# Patient Record
Sex: Male | Born: 1950 | Race: White | Hispanic: No | Marital: Married | Smoking: Never smoker
Health system: Southern US, Community
[De-identification: ages and names within clinical notes are randomized; demographics above are authoritative.]

## PROBLEM LIST (undated history)

## (undated) DIAGNOSIS — I10 Essential (primary) hypertension: Secondary | ICD-10-CM

## (undated) DIAGNOSIS — F32A Depression, unspecified: Secondary | ICD-10-CM

## (undated) DIAGNOSIS — R002 Palpitations: Secondary | ICD-10-CM

## (undated) DIAGNOSIS — I471 Supraventricular tachycardia: Secondary | ICD-10-CM

## (undated) DIAGNOSIS — M1990 Unspecified osteoarthritis, unspecified site: Secondary | ICD-10-CM

## (undated) DIAGNOSIS — C801 Malignant (primary) neoplasm, unspecified: Secondary | ICD-10-CM

## (undated) DIAGNOSIS — R7303 Prediabetes: Secondary | ICD-10-CM

## (undated) DIAGNOSIS — K219 Gastro-esophageal reflux disease without esophagitis: Secondary | ICD-10-CM

## (undated) DIAGNOSIS — E785 Hyperlipidemia, unspecified: Secondary | ICD-10-CM

## (undated) HISTORY — DX: Hyperlipidemia, unspecified: E78.5

## (undated) HISTORY — DX: Palpitations: R00.2

## (undated) HISTORY — DX: Essential (primary) hypertension: I10

## (undated) HISTORY — DX: Gastro-esophageal reflux disease without esophagitis: K21.9

## (undated) HISTORY — PX: HERNIA REPAIR: SHX51

---

## 1998-07-13 ENCOUNTER — Ambulatory Visit (HOSPITAL_COMMUNITY): Admission: RE | Admit: 1998-07-13 | Discharge: 1998-07-13 | Payer: Self-pay | Admitting: Family Medicine

## 1998-07-13 ENCOUNTER — Encounter: Payer: Self-pay | Admitting: Family Medicine

## 1999-10-03 ENCOUNTER — Ambulatory Visit (HOSPITAL_COMMUNITY): Admission: RE | Admit: 1999-10-03 | Discharge: 1999-10-03 | Payer: Self-pay | Admitting: Family Medicine

## 1999-10-03 ENCOUNTER — Encounter: Payer: Self-pay | Admitting: Family Medicine

## 2015-02-02 ENCOUNTER — Emergency Department (HOSPITAL_COMMUNITY)
Admission: EM | Admit: 2015-02-02 | Discharge: 2015-02-03 | Disposition: A | Discharge status: Home / Self Care | Payer: 59 | Attending: Emergency Medicine | Admitting: Emergency Medicine

## 2015-02-02 ENCOUNTER — Encounter (HOSPITAL_COMMUNITY): Payer: Self-pay | Admitting: *Deleted

## 2015-02-02 DIAGNOSIS — E785 Hyperlipidemia, unspecified: Secondary | ICD-10-CM | POA: Diagnosis not present

## 2015-02-02 DIAGNOSIS — R339 Retention of urine, unspecified: Secondary | ICD-10-CM | POA: Diagnosis present

## 2015-02-02 DIAGNOSIS — N390 Urinary tract infection, site not specified: Secondary | ICD-10-CM

## 2015-02-02 DIAGNOSIS — E876 Hypokalemia: Secondary | ICD-10-CM | POA: Insufficient documentation

## 2015-02-02 DIAGNOSIS — R140 Abdominal distension (gaseous): Secondary | ICD-10-CM | POA: Insufficient documentation

## 2015-02-02 DIAGNOSIS — Z8719 Personal history of other diseases of the digestive system: Secondary | ICD-10-CM | POA: Diagnosis not present

## 2015-02-02 DIAGNOSIS — Z7982 Long term (current) use of aspirin: Secondary | ICD-10-CM | POA: Diagnosis not present

## 2015-02-02 DIAGNOSIS — I10 Essential (primary) hypertension: Secondary | ICD-10-CM | POA: Insufficient documentation

## 2015-02-02 DIAGNOSIS — Z79899 Other long term (current) drug therapy: Secondary | ICD-10-CM | POA: Diagnosis not present

## 2015-02-02 LAB — URINALYSIS, ROUTINE W REFLEX MICROSCOPIC
Bilirubin Urine: NEGATIVE
GLUCOSE, UA: NEGATIVE mg/dL
HGB URINE DIPSTICK: NEGATIVE
Ketones, ur: NEGATIVE mg/dL
Leukocytes, UA: NEGATIVE
Nitrite: POSITIVE — AB
Protein, ur: NEGATIVE mg/dL
SPECIFIC GRAVITY, URINE: 1.015 (ref 1.005–1.030)
Urobilinogen, UA: 0.2 mg/dL (ref 0.0–1.0)
pH: 5.5 (ref 5.0–8.0)

## 2015-02-02 LAB — URINE MICROSCOPIC-ADD ON

## 2015-02-02 NOTE — ED Provider Notes (Signed)
CSN: [id number]     Arrival date & time 02/02/15  2246 History  By signing my name below, I, Hilda Lias, attest that this documentation has been prepared under the direction and in the presence of Merryl Hacker, MD. Electronically Signed: Hilda Lias, ED Scribe. 02/02/2015. 11:20 PM.  Chief Complaint  Patient presents with  . Urinary Retention      The history is provided by the patient. No language interpreter was used.   HPI Comments: Edwin Levine is a 64 y.o. male who presents to the Emergency Department complaining of urinary retention that has been present since earlier today. Pt states that he has not been able to urinate for around 4 hours, and reports when he urinated earlier today that he produced very little, and it was painful to do so. Patient does report that he went all day at work without urinating and feels this contributed to his urinary retention. Pt states he has never had difficulty urinating in the past. Pt reports drinking a lot of water and cranberry juice prior to arrival at ED, but states he still cannot urinate. Pt denies back pain or any other symptoms. Denies fevers.    Past Medical History  Diagnosis Date  . Hypertension   . Hyperlipidemia   . GERD (gastroesophageal reflux disease)   . Palpitations    Past Surgical History  Procedure Laterality Date  . Hernia repair     History reviewed. No pertinent family history. Social History  Substance Use Topics  . Smoking status: Never Smoker   . Smokeless tobacco: None  . Alcohol Use: No    Review of Systems  Constitutional: Negative.  Negative for fever.  Respiratory: Negative.  Negative for chest tightness and shortness of breath.   Cardiovascular: Negative.  Negative for chest pain.  Gastrointestinal: Positive for abdominal distention. Negative for nausea, vomiting and abdominal pain.  Genitourinary: Positive for dysuria and difficulty urinating. Negative for flank pain.  All other  systems reviewed and are negative.     Allergies  Review of patient's allergies indicates no known allergies.  Home Medications   Prior to Admission medications   Medication Sig Start Date End Date Taking? Authorizing Provider  acetaminophen (TYLENOL) 500 MG tablet Take 1,500 mg by mouth once as needed for mild pain or moderate pain.   Yes Historical Provider, MD  aspirin 81 MG tablet Take 81 mg by mouth daily.   Yes Historical Provider, MD  atorvastatin (LIPITOR) 40 MG tablet Take 40 mg by mouth daily.   Yes Historical Provider, MD  Cholecalciferol (VITAMIN D) 2000 UNITS CAPS Take 1 capsule by mouth daily.   Yes Historical Provider, MD  Cinnamon 500 MG capsule Take 1,000 mg by mouth daily.   Yes Historical Provider, MD  lisinopril-hydrochlorothiazide (PRINZIDE,ZESTORETIC) 20-25 MG per tablet Take 1 tablet by mouth daily.   Yes Historical Provider, MD  naproxen sodium (ANAPROX) 220 MG tablet Take 220 mg by mouth 2 (two) times daily as needed (for pain).    Yes Historical Provider, MD  Omega-3 Fatty Acids (FISH OIL) 1000 MG CAPS Take 1 capsule by mouth daily.    Yes Historical Provider, MD  S-Adenosylmethionine-B6-B12-FA (MOOD PLUS STRESS RELIEF PO) Take 1 tablet by mouth daily.   Yes Historical Provider, MD  sildenafil (VIAGRA) 100 MG tablet Take 100 mg by mouth as needed for erectile dysfunction.   Yes Historical Provider, MD  ciprofloxacin (CIPRO) 500 MG tablet Take 1 tablet (500 mg total) by mouth 2 (  two) times daily. 02/03/15   Merryl Hacker, MD   BP 154/70 mmHg  Pulse 83  Temp(Src) 98.1 F (36.7 C) (Oral)  Resp 20  Ht 5\' 9"  (1.753 m)  Wt 210 lb (95.255 kg)  BMI 31.00 kg/m2  SpO2 98% Physical Exam  Constitutional: He is oriented to person, place, and time. No distress.  Uncomfortable appearing  HENT:  Head: Normocephalic and atraumatic.  Cardiovascular: Normal rate, regular rhythm and normal heart sounds.   No murmur heard. Pulmonary/Chest: Effort normal and breath  sounds normal. No respiratory distress. He has no wheezes.  Abdominal: Soft. Bowel sounds are normal. He exhibits distension. There is tenderness. There is no rebound.  Fullness noted over the suprapubic region with mild tenderness to palpation  Musculoskeletal: He exhibits no edema.  Neurological: He is alert and oriented to person, place, and time.  Skin: Skin is warm and dry.  Psychiatric: He has a normal mood and affect.  Nursing note and vitals reviewed.   ED Course  Procedures (including critical care time)  DIAGNOSTIC STUDIES: Oxygen Saturation is 98% on room air, normal by my interpretation.    COORDINATION OF CARE: 11:20 PM Discussed treatment plan with pt at bedside and pt agreed to plan.   Labs Review Labs Reviewed  URINALYSIS, ROUTINE W REFLEX MICROSCOPIC (NOT AT [facility]) - Abnormal; Notable for the following:    Nitrite POSITIVE (*)    All other components within normal limits  BASIC METABOLIC PANEL - Abnormal; Notable for the following:    Sodium 131 (*)    Potassium 3.0 (*)    Chloride 98 (*)    Glucose, Bld 127 (*)    All other components within normal limits  URINE CULTURE  URINE MICROSCOPIC-ADD ON    Imaging Review No results found. I have personally reviewed and evaluated these images and lab results as part of my medical decision-making.   EKG Interpretation None      MDM   Final diagnoses:  Urinary retention  UTI (lower urinary tract infection)  Hypokalemia    Patient presents with urinary retention. Reports that he has had dysuria and minimal urinary output today after holding his urine at work. Nontoxic on exam. Suprapubic fullness but no significant tenderness. Afebrile. After attempting to urinate, patient was able to produce a small amount of urine.  Postvoid residual was greater than 900 mL. Urinary catheter placed. Urinalysis is nitrite positive with 0-2 white cells and few bacteria. Will place on antibiotics.  Patient with mild  hypokalemia. Renal function preserved. Will discharge home with Cipro and urology follow-up. Catheterto be removed in 3-5 days.  Discussed with patient and his wife. Return precautions given.  After history, exam, and medical workup I feel the patient has been appropriately medically screened and is safe for discharge home. Pertinent diagnoses were discussed with the patient. Patient was given return precautions.  I personally performed the services described in this documentation, which was scribed in my presence. The recorded information has been reviewed and is accurate.    Merryl Hacker, MD 02/03/15 [phone]

## 2015-02-02 NOTE — ED Notes (Addendum)
Pt states he has been unable to urinate x 3.5-4 hrs. Pt also c/o lower abdominal pain. Pt also c/o urinary frequency with a very small amount of urine.

## 2015-02-03 LAB — BASIC METABOLIC PANEL
ANION GAP: 7 (ref 5–15)
BUN: 18 mg/dL (ref 6–20)
CALCIUM: 9 mg/dL (ref 8.9–10.3)
CO2: 26 mmol/L (ref 22–32)
Chloride: 98 mmol/L — ABNORMAL LOW (ref 101–111)
Creatinine, Ser: 1.04 mg/dL (ref 0.61–1.24)
GFR calc Af Amer: >60 mL/min (ref >60)
Glucose, Bld: 127 mg/dL — ABNORMAL HIGH (ref 65–99)
POTASSIUM: 3 mmol/L — AB (ref 3.5–5.1)
SODIUM: 131 mmol/L — AB (ref 135–145)

## 2015-02-03 MED ORDER — CIPROFLOXACIN HCL 500 MG PO TABS: 500.0000 mg | ORAL_TABLET | Freq: Two times a day (BID) | ORAL | Status: DC

## 2015-02-03 NOTE — ED Notes (Signed)
Patient given discharge instruction, verbalized understand. Patient ambulatory out of the department.  

## 2015-02-03 NOTE — Discharge Instructions (Signed)
Acute Urinary Retention, Male °Acute urinary retention is the temporary inability to urinate. °This is a common problem in older men. As men age their prostates become larger and block the flow of urine from the bladder. This is usually a problem that has come on gradually.  °HOME CARE INSTRUCTIONS °If you are sent home with a Foley catheter and a drainage system, you will need to discuss the best course of action with your health care provider. While the catheter is in, maintain a good intake of fluids. Keep the drainage bag emptied and lower than your catheter. This is so that contaminated urine will not flow back into your bladder, which could lead to a urinary tract infection. °There are two main types of drainage bags. One is a large bag that usually is used at night. It has a good capacity that will allow you to sleep through the night without having to empty it. The second type is called a leg bag. It has a smaller capacity, so it needs to be emptied more frequently. However, the main advantage is that it can be attached by a leg strap and can go underneath your clothing, allowing you the freedom to move about or leave your home. °Only take over-the-counter or prescription medicines for pain, discomfort, or fever as directed by your health care provider.  °SEEK MEDICAL CARE IF: °· You develop a low-grade fever. °· You experience spasms or leakage of urine with the spasms. °SEEK IMMEDIATE MEDICAL CARE IF:  °· You develop chills or fever. °· Your catheter stops draining urine. °· Your catheter falls out. °· You start to develop increased bleeding that does not respond to rest and increased fluid intake. °MAKE SURE YOU: °· Understand these instructions. °· Will watch your condition. °· Will get help right away if you are not doing well or get worse. °  °This information is not intended to replace advice given to you by your health care provider. Make sure you discuss any questions you have with your health care  provider. °  °Document Released: 07/17/2000 Document Revised: 08/25/2014 Document Reviewed: 09/19/2012 °Elsevier Interactive Patient Education ©2016 [employer] ° ° °Urinary Tract Infection °Urinary tract infections (UTIs) can develop anywhere along your urinary tract. Your urinary tract is your body's drainage system for removing wastes and extra water. Your urinary tract includes two kidneys, two ureters, a bladder, and a urethra. Your kidneys are a pair of bean-shaped organs. Each kidney is about the size of your fist. They are located below your ribs, one on each side of your spine. °CAUSES °Infections are caused by microbes, which are microscopic organisms, including fungi, viruses, and bacteria. These organisms are so small that they can only be seen through a microscope. Bacteria are the microbes that most commonly cause UTIs. °SYMPTOMS  °Symptoms of UTIs may vary by age and gender of the patient and by the location of the infection. Symptoms in young women typically include a frequent and intense urge to urinate and a painful, burning feeling in the bladder or urethra during urination. Older women and men are more likely to be tired, shaky, and weak and have muscle aches and abdominal pain. A fever may mean the infection is in your kidneys. Other symptoms of a kidney infection include pain in your back or sides below the ribs, nausea, and vomiting. °DIAGNOSIS °To diagnose a UTI, your caregiver will ask you about your symptoms. Your caregiver will also ask you to provide a urine sample. The urine   be tested for bacteria and white blood cells. White blood cells are made by your body to help fight infection. TREATMENT  Typically, UTIs can be treated with medication. Because most UTIs are caused by a bacterial infection, they usually can be treated with the use of antibiotics. The choice of antibiotic and length of treatment depend on your symptoms and the type of bacteria causing your  infection. HOME CARE INSTRUCTIONS  If you were prescribed antibiotics, take them exactly as your caregiver instructs you. Finish the medication even if you feel better after you have only taken some of the medication.  Drink enough water and fluids to keep your urine clear or pale yellow.  Avoid caffeine, tea, and carbonated beverages. They tend to irritate your bladder.  Empty your bladder often. Avoid holding urine for long periods of time.  Empty your bladder before and after sexual intercourse.  After a bowel movement, women should cleanse from front to back. Use each tissue only once. SEEK MEDICAL CARE IF:   You have back pain.  You develop a fever.  Your symptoms do not begin to resolve within 3 days. SEEK IMMEDIATE MEDICAL CARE IF:   You have severe back pain or lower abdominal pain.  You develop chills.  You have nausea or vomiting.  You have continued burning or discomfort with urination. MAKE SURE YOU:   Understand these instructions.  Will watch your condition.  Will get help right away if you are not doing well or get worse.   This information is not intended to replace advice given to you by your health care provider. Make sure you discuss any questions you have with your health care provider.   Document Released: 01/18/2005 Document Revised: 12/30/2014 Document Reviewed: 05/19/2011 Elsevier Interactive Patient Education 2016 [employer]. Hypokalemia Hypokalemia means that the amount of potassium in the blood is lower than normal.Potassium is a chemical, called an electrolyte, that helps regulate the amount of fluid in the body. It also stimulates muscle contraction and helps nerves function properly.Most of the body's potassium is inside of cells, and only a very small amount is in the blood. Because the amount in the blood is so small, minor changes can be life-threatening. CAUSES  Antibiotics.  Diarrhea or vomiting.  Using laxatives too much,  which can cause diarrhea.  Chronic kidney disease.  Water pills (diuretics).  Eating disorders (bulimia).  Low magnesium level.  Sweating a lot. SIGNS AND SYMPTOMS  Weakness.  Constipation.  Fatigue.  Muscle cramps.  Mental confusion.  Skipped heartbeats or irregular heartbeat (palpitations).  Tingling or numbness. DIAGNOSIS  Your health care provider can diagnose hypokalemia with blood tests. In addition to checking your potassium level, your health care provider may also check other lab tests. TREATMENT Hypokalemia can be treated with potassium supplements taken by mouth or adjustments in your current medicines. If your potassium level is very low, you may need to get potassium through a vein (IV) and be monitored in the hospital. A diet high in potassium is also helpful. Foods high in potassium are:  Nuts, such as peanuts and pistachios.  Seeds, such as sunflower seeds and pumpkin seeds.  Peas, lentils, and lima beans.  Whole grain and bran cereals and breads.  Fresh fruit and vegetables, such as apricots, avocado, bananas, cantaloupe, kiwi, oranges, tomatoes, asparagus, and potatoes.  Orange and tomato juices.  Red meats.  Fruit yogurt. HOME CARE INSTRUCTIONS  Take all medicines as prescribed by your health care provider.  Maintain a  healthy diet by including nutritious food, such as fruits, vegetables, nuts, whole grains, and lean meats.  If you are taking a laxative, be sure to follow the directions on the label. SEEK MEDICAL CARE IF:  Your weakness gets worse.  You feel your heart pounding or racing.  You are vomiting or having diarrhea.  You are diabetic and having trouble keeping your blood glucose in the normal range. SEEK IMMEDIATE MEDICAL CARE IF:  You have chest pain, shortness of breath, or dizziness.  You are vomiting or having diarrhea for more than 2 days.  You faint. MAKE SURE YOU:   Understand these instructions.  Will watch  your condition.  Will get help right away if you are not doing well or get worse.   This information is not intended to replace advice given to you by your health care provider. Make sure you discuss any questions you have with your health care provider.   Document Released: 04/10/2005 Document Revised: 05/01/2014 Document Reviewed: 10/11/2012 Elsevier Interactive Patient Education [employer].

## 2015-02-03 NOTE — ED Notes (Signed)
Emptied total of 1800 cc output yellow, clear urine, changed to leg bag

## 2015-02-04 LAB — URINE CULTURE: CULTURE: NO GROWTH

## 2016-02-14 ENCOUNTER — Emergency Department (HOSPITAL_COMMUNITY): Payer: 59

## 2016-02-14 ENCOUNTER — Encounter (HOSPITAL_COMMUNITY): Payer: Self-pay | Admitting: Emergency Medicine

## 2016-02-14 ENCOUNTER — Observation Stay (HOSPITAL_COMMUNITY)
Admission: EM | Admit: 2016-02-14 | Discharge: 2016-02-17 | Disposition: A | Discharge status: Home / Self Care | Payer: 59 | Attending: Internal Medicine | Admitting: Internal Medicine

## 2016-02-14 DIAGNOSIS — E042 Nontoxic multinodular goiter: Secondary | ICD-10-CM | POA: Diagnosis not present

## 2016-02-14 DIAGNOSIS — N390 Urinary tract infection, site not specified: Secondary | ICD-10-CM

## 2016-02-14 DIAGNOSIS — B961 Klebsiella pneumoniae [K. pneumoniae] as the cause of diseases classified elsewhere: Secondary | ICD-10-CM | POA: Diagnosis not present

## 2016-02-14 DIAGNOSIS — R7302 Impaired glucose tolerance (oral): Secondary | ICD-10-CM | POA: Insufficient documentation

## 2016-02-14 DIAGNOSIS — E876 Hypokalemia: Secondary | ICD-10-CM | POA: Diagnosis not present

## 2016-02-14 DIAGNOSIS — Z7982 Long term (current) use of aspirin: Secondary | ICD-10-CM | POA: Insufficient documentation

## 2016-02-14 DIAGNOSIS — Z8249 Family history of ischemic heart disease and other diseases of the circulatory system: Secondary | ICD-10-CM | POA: Insufficient documentation

## 2016-02-14 DIAGNOSIS — E785 Hyperlipidemia, unspecified: Secondary | ICD-10-CM | POA: Insufficient documentation

## 2016-02-14 DIAGNOSIS — N3000 Acute cystitis without hematuria: Secondary | ICD-10-CM | POA: Insufficient documentation

## 2016-02-14 DIAGNOSIS — R338 Other retention of urine: Secondary | ICD-10-CM | POA: Insufficient documentation

## 2016-02-14 DIAGNOSIS — R55 Syncope and collapse: Secondary | ICD-10-CM

## 2016-02-14 DIAGNOSIS — W2209XA Striking against other stationary object, initial encounter: Secondary | ICD-10-CM | POA: Diagnosis not present

## 2016-02-14 DIAGNOSIS — R350 Frequency of micturition: Secondary | ICD-10-CM | POA: Insufficient documentation

## 2016-02-14 DIAGNOSIS — K219 Gastro-esophageal reflux disease without esophagitis: Secondary | ICD-10-CM | POA: Insufficient documentation

## 2016-02-14 DIAGNOSIS — I471 Supraventricular tachycardia, unspecified: Secondary | ICD-10-CM | POA: Diagnosis present

## 2016-02-14 DIAGNOSIS — I10 Essential (primary) hypertension: Secondary | ICD-10-CM | POA: Insufficient documentation

## 2016-02-14 DIAGNOSIS — R002 Palpitations: Secondary | ICD-10-CM

## 2016-02-14 DIAGNOSIS — S0003XA Contusion of scalp, initial encounter: Secondary | ICD-10-CM | POA: Diagnosis not present

## 2016-02-14 DIAGNOSIS — N401 Enlarged prostate with lower urinary tract symptoms: Secondary | ICD-10-CM | POA: Insufficient documentation

## 2016-02-14 DIAGNOSIS — I4892 Unspecified atrial flutter: Secondary | ICD-10-CM | POA: Insufficient documentation

## 2016-02-14 LAB — URINALYSIS, ROUTINE W REFLEX MICROSCOPIC
BILIRUBIN URINE: NEGATIVE
Glucose, UA: NEGATIVE mg/dL
HGB URINE DIPSTICK: NEGATIVE
KETONES UR: NEGATIVE mg/dL
NITRITE: POSITIVE — AB
PH: 6 (ref 5.0–8.0)
Protein, ur: NEGATIVE mg/dL
Specific Gravity, Urine: 1.014 (ref 1.005–1.030)

## 2016-02-14 LAB — CBC
HEMATOCRIT: 40.3 % (ref 39.0–52.0)
Hemoglobin: 13.9 g/dL (ref 13.0–17.0)
MCH: 32.2 pg (ref 26.0–34.0)
MCHC: 34.5 g/dL (ref 30.0–36.0)
MCV: 93.3 fL (ref 78.0–100.0)
Platelets: 205 10*3/uL (ref 150–400)
RBC: 4.32 MIL/uL (ref 4.22–5.81)
RDW: 12.6 % (ref 11.5–15.5)
WBC: 8.1 10*3/uL (ref 4.0–10.5)

## 2016-02-14 LAB — BASIC METABOLIC PANEL
Anion gap: 10 (ref 5–15)
BUN: 16 mg/dL (ref 6–20)
CALCIUM: 9.7 mg/dL (ref 8.9–10.3)
CO2: 25 mmol/L (ref 22–32)
Chloride: 103 mmol/L (ref 101–111)
Creatinine, Ser: 0.95 mg/dL (ref 0.61–1.24)
GFR calc Af Amer: >60 mL/min (ref >60)
GLUCOSE: 110 mg/dL — AB (ref 65–99)
POTASSIUM: 3.3 mmol/L — AB (ref 3.5–5.1)
Sodium: 138 mmol/L (ref 135–145)

## 2016-02-14 LAB — TROPONIN I: Troponin I: <0.03 ng/mL (ref <0.03)

## 2016-02-14 LAB — URINE MICROSCOPIC-ADD ON

## 2016-02-14 LAB — I-STAT TROPONIN, ED: Troponin i, poc: 0 ng/mL (ref 0.00–0.08)

## 2016-02-14 MED ORDER — TAMSULOSIN HCL 0.4 MG PO CAPS: 0.8000 mg | ORAL_CAPSULE | Freq: Every day | ORAL | Status: DC

## 2016-02-14 MED ORDER — TAMSULOSIN HCL 0.4 MG PO CAPS
0.8000 mg | ORAL_CAPSULE | Freq: Every day | ORAL | Status: DC
  Administered 2016-02-14: 0.8 mg via ORAL
  Filled 2016-02-14: qty 2

## 2016-02-14 MED ORDER — SODIUM CHLORIDE 0.9% FLUSH
3.0000 mL | Freq: Two times a day (BID) | INTRAVENOUS | Status: DC
  Administered 2016-02-14 – 2016-02-17 (×6): 3 mL via INTRAVENOUS

## 2016-02-14 MED ORDER — ATORVASTATIN CALCIUM 40 MG PO TABS
40.0000 mg | ORAL_TABLET | Freq: Every day | ORAL | Status: DC
  Administered 2016-02-15 – 2016-02-17 (×3): 40 mg via ORAL
  Filled 2016-02-14 (×3): qty 1

## 2016-02-14 MED ORDER — ACETAMINOPHEN 650 MG RE SUPP: 650.0000 mg | Freq: Four times a day (QID) | RECTAL | Status: DC | PRN

## 2016-02-14 MED ORDER — SODIUM CHLORIDE 0.9 % IV BOLUS (SEPSIS)
1000.0000 mL | Freq: Once | INTRAVENOUS | Status: AC
  Administered 2016-02-14: 1000 mL via INTRAVENOUS

## 2016-02-14 MED ORDER — LISINOPRIL 20 MG PO TABS
20.0000 mg | ORAL_TABLET | Freq: Every day | ORAL | Status: DC
  Administered 2016-02-15: 20 mg via ORAL
  Filled 2016-02-14: qty 1

## 2016-02-14 MED ORDER — SODIUM CHLORIDE 0.9 % IV SOLN
INTRAVENOUS | Status: AC
  Administered 2016-02-14: 23:00:00 via INTRAVENOUS

## 2016-02-14 MED ORDER — CEPHALEXIN 500 MG PO CAPS
500.0000 mg | ORAL_CAPSULE | Freq: Three times a day (TID) | ORAL | Status: DC
  Administered 2016-02-14: 500 mg via ORAL
  Filled 2016-02-14: qty 2

## 2016-02-14 MED ORDER — HYDROCODONE-ACETAMINOPHEN 5-325 MG PO TABS: 1.0000 | ORAL_TABLET | ORAL | Status: DC | PRN

## 2016-02-14 MED ORDER — ONDANSETRON HCL 4 MG/2ML IJ SOLN: 4.0000 mg | Freq: Four times a day (QID) | INTRAMUSCULAR | Status: DC | PRN

## 2016-02-14 MED ORDER — POTASSIUM CHLORIDE 10 MEQ/100ML IV SOLN
10.0000 meq | INTRAVENOUS | Status: AC
  Administered 2016-02-14 – 2016-02-15 (×2): 10 meq via INTRAVENOUS
  Filled 2016-02-14: qty 100

## 2016-02-14 MED ORDER — ASPIRIN EC 81 MG PO TBEC
81.0000 mg | DELAYED_RELEASE_TABLET | Freq: Every day | ORAL | Status: DC
  Administered 2016-02-15 – 2016-02-17 (×3): 81 mg via ORAL
  Filled 2016-02-14 (×3): qty 1

## 2016-02-14 MED ORDER — ENOXAPARIN SODIUM 40 MG/0.4ML ~~LOC~~ SOLN
40.0000 mg | SUBCUTANEOUS | Status: DC
  Administered 2016-02-14 – 2016-02-16 (×3): 40 mg via SUBCUTANEOUS
  Filled 2016-02-14 (×3): qty 0.4

## 2016-02-14 MED ORDER — ONDANSETRON HCL 4 MG PO TABS: 4.0000 mg | ORAL_TABLET | Freq: Four times a day (QID) | ORAL | Status: DC | PRN

## 2016-02-14 MED ORDER — DEXTROSE 5 % IV SOLN
1.0000 g | INTRAVENOUS | Status: DC
  Administered 2016-02-15 – 2016-02-16 (×2): 1 g via INTRAVENOUS
  Filled 2016-02-14 (×3): qty 10

## 2016-02-14 MED ORDER — POTASSIUM CHLORIDE CRYS ER 20 MEQ PO TBCR
40.0000 meq | EXTENDED_RELEASE_TABLET | Freq: Once | ORAL | Status: AC
  Administered 2016-02-14: 40 meq via ORAL
  Filled 2016-02-14: qty 2

## 2016-02-14 MED ORDER — ACETAMINOPHEN 325 MG PO TABS
650.0000 mg | ORAL_TABLET | Freq: Four times a day (QID) | ORAL | Status: DC | PRN
Start: 2016-02-14 — End: 2016-02-17

## 2016-02-14 MED ORDER — DEXTROSE 5 % IV SOLN
1.0000 g | Freq: Once | INTRAVENOUS | Status: AC
  Administered 2016-02-14: 1 g via INTRAVENOUS
  Filled 2016-02-14: qty 10

## 2016-02-14 NOTE — ED Notes (Signed)
Patient returned from X-ray 

## 2016-02-14 NOTE — ED Notes (Signed)
Resident at bedside.  

## 2016-02-14 NOTE — ED Notes (Signed)
Pt wheeled back to room from waiting room. Pt ambulated to restroom from room, tolerated well.

## 2016-02-14 NOTE — ED Notes (Signed)
Attempted to give reportx1 

## 2016-02-14 NOTE — ED Notes (Addendum)
Patient transported to CT 

## 2016-02-14 NOTE — ED Triage Notes (Signed)
Pt states was standing outside on concrete at work and felt like he was having a "spell where his heart beats irregularity and bared down like he having a BM and woke up on the ground." Pt has abrasion to back on head. Pt is alert and 0x4.

## 2016-02-14 NOTE — H&P (Addendum)
Edwin Levine [id number] DOB: 04/13/1951 DOA: 02/14/2016     PCP: [facility] Specialists: Cardiology  Patient coming from:    home Lives With family    Chief Complaint: Syncope  HPI: Edwin Levine is a 65 y.o. male with medical history significant of arrhythmias possible SVT, hypertension, HLD    Presented with syncopal event at work. Patient has long-standing history of arrhythmias which describes as palpitations which she controls with Valsalva maneuver. Lately his symptoms have been getting worse needs been progressively more frequent and more significant. Today he was at work and had a spell he felt that he had irregular heartbeat he try to have a Valsalva maneuver but woke up on the ground apparently he hit his head. Patient describes sensation of lightheadedness prior to syncopal event. No witnessed seizure activity Of note few weeks ago patient has been diagnosed with urinary tract infection treated with ciprofloxacin but he continues to have suprapubic pain. He denies any associated chest pain or shortness of breath no nausea vomiting no constipation this is first syncopal episode. Patient reports that usually if he drinks caffeinated drink he has palpitations. He has been drinking green tea a little more than usual for the past 3 or 4 months. 4 months ago his Flomax has been increased. Patient reports he have had stress test done, cardiac catheterization and wore a monitor for 30 days but his symptoms were not active during that time reports this was 5 years ago.    IN ER:  Temp (24hrs), Avg:97.8 F (36.6 C), Min:97.8 F (36.6 C), Max:97.8 F (36.6 C)      RR 25 HR 70 BP 1 41/85 Troponin 0 Sodium 138 K3.3 Chest x-ray negative Following Medications were ordered in ER: Medications  cephALEXin (KEFLEX) capsule 500 mg (500 mg Oral Given 02/14/16 1855)  cefTRIAXone (ROCEPHIN) 1 g in dextrose 5 % 50 mL IVPB (not administered)  potassium chloride SA  (K-DUR,KLOR-CON) CR tablet 40 mEq (not administered)      Hospitalist was called for admission for syncope  Review of Systems:    Pertinent positives include: syncope, dizziness, palpitations  Constitutional:  No weight loss, night sweats, Fevers, chills, fatigue, weight loss  HEENT:  No headaches, Difficulty swallowing,Tooth/dental problems,Sore throat,  No sneezing, itching, ear ache, nasal congestion, post nasal drip,  Cardio-vascular:  No chest pain, Orthopnea, PND, anasarca, .no Bilateral lower extremity swelling  GI:  No heartburn, indigestion, abdominal pain, nausea, vomiting, diarrhea, change in bowel habits, loss of appetite, melena, blood in stool, hematemesis Resp:  no shortness of breath at rest. No dyspnea on exertion, No excess mucus, no productive cough, No non-productive cough, No coughing up of blood.No change in color of mucus.No wheezing. Skin:  no rash or lesions. No jaundice GU:  no dysuria, change in color of urine, no urgency or frequency. No straining to urinate.  No flank pain.  Musculoskeletal:  No joint pain or no joint swelling. No decreased range of motion. No back pain.  Psych:  No change in mood or affect. No depression or anxiety. No memory loss.  Neuro: no localizing neurological complaints, no tingling, no weakness, no double vision, no gait abnormality, no slurred speech, no confusion  As per HPI otherwise 10 point review of systems negative.   Past Medical History: Past Medical History:  Diagnosis Date  . GERD (gastroesophageal reflux disease)   . Hyperlipidemia   . Hypertension   . Palpitations    Past Surgical History:  Procedure  Laterality Date  . HERNIA REPAIR       Social History:  Ambulatory   Independently    reports that he has never smoked. He does not have any smokeless tobacco history on file. He reports that he does not drink alcohol or use drugs.  Allergies:  No Known Allergies     Family History:   Family  History  Problem Relation Age of Onset  . Heart Problems Mother   . Aneurysm Father   . Diabetes Neg Hx   . Cancer Neg Hx   . Stroke Neg Hx     Medications: Prior to Admission medications   Medication Sig Start Date End Date Taking? Authorizing Provider  acetaminophen (TYLENOL) 325 MG tablet Take 650 mg by mouth every 6 (six) hours as needed for mild pain.   Yes Historical Provider, MD  aspirin 81 MG tablet Take 81 mg by mouth daily.   Yes Historical Provider, MD  Aspirin-Acetaminophen-Caffeine (GOODY HEADACHE PO) Take 1 packet by mouth daily as needed (headache).   Yes Historical Provider, MD  atorvastatin (LIPITOR) 40 MG tablet Take 40 mg by mouth daily.   Yes Historical Provider, MD  Cyanocobalamin (VITAMIN B-12 PO) Take 1 tablet by mouth daily.   Yes Historical Provider, MD  lisinopril-hydrochlorothiazide (PRINZIDE,ZESTORETIC) 20-25 MG per tablet Take 1 tablet by mouth daily.   Yes Historical Provider, MD  naproxen sodium (ANAPROX) 220 MG tablet Take 220 mg by mouth 2 (two) times daily as needed (for pain).    Yes Historical Provider, MD  Omega-3 Fatty Acids (FISH OIL) 1000 MG CAPS Take 1 capsule by mouth daily.    Yes Historical Provider, MD  S-Adenosylmethionine-B6-B12-FA (MOOD PLUS STRESS RELIEF PO) Take 1 tablet by mouth daily.   Yes Historical Provider, MD  tamsulosin (FLOMAX) 0.4 MG CAPS capsule Take 0.8 mg by mouth daily after supper.   Yes Historical Provider, MD  ciprofloxacin (CIPRO) 500 MG tablet Take 1 tablet (500 mg total) by mouth 2 (two) times daily. Patient not taking: Reported on 02/14/2016 02/03/15   Merryl Hacker, MD    Physical Exam: Patient Vitals for the past 24 hrs:  BP Temp Temp src Pulse Resp SpO2 Height Weight  02/14/16 1845 122/78 - - 66 13 95 % - -  02/14/16 1830 134/84 - - 65 14 95 % - -  02/14/16 1824 134/81 - - 69 13 93 % - -  02/14/16 1815 134/81 - - 72 19 96 % - -  02/14/16 1800 138/81 - - 68 - 94 % - -  02/14/16 1700 122/70 - - 78 16 93 % -  -  02/14/16 1645 132/78 - - 72 14 94 % - -  02/14/16 1616 133/81 - - 78 19 96 % - -  02/14/16 1615 149/81 - - 82 11 96 % - -  02/14/16 1426 129/77 97.8 F (36.6 C) Oral 86 16 95 % 5\' 6"  (1.676 m) 95.3 kg (210 lb)    1. General:  in No Acute distress 2. Psychological: Alert and Oriented 3. Head/ENT:     Dry Mucous Membranes                          Head Non traumatic, neck supple                          Normal  Dentition 4. SKIN:  decreased Skin turgor,  Skin clean  Dry and intact no rash 5. Heart: Regular rate and rhythm no Murmur, Rub or gallop 6. Lungs:   Clear to auscultation bilaterally, no wheezes or crackles   7. Abdomen: Soft, non-tender, Non distended 8. Lower extremities: no clubbing, cyanosis, or edema 9. Neurologically Grossly intact, moving all 4 extremities equally  10. MSK: Normal range of motion   body mass index is 33.89 kg/m.  Labs on Admission:   Labs on Admission: I have personally reviewed following labs and imaging studies  CBC:  Recent Labs Lab 02/14/16 1428  WBC 8.1  HGB 13.9  HCT 40.3  MCV 93.3  PLT 99991111   Basic Metabolic Panel:  Recent Labs Lab 02/14/16 1428  NA 138  K 3.3*  CL 103  CO2 25  GLUCOSE 110*  BUN 16  CREATININE 0.95  CALCIUM 9.7   GFR: Estimated Creatinine Clearance: 86 mL/min (by C-G formula based on SCr of 0.95 mg/dL). Liver Function Tests: No results for input(s): AST, ALT, ALKPHOS, BILITOT, PROT, ALBUMIN in the last 168 hours. No results for input(s): LIPASE, AMYLASE in the last 168 hours. No results for input(s): AMMONIA in the last 168 hours. Coagulation Profile: No results for input(s): INR, PROTIME in the last 168 hours. Cardiac Enzymes: No results for input(s): CKTOTAL, CKMB, CKMBINDEX, TROPONINI in the last 168 hours. BNP (last 3 results) No results for input(s): PROBNP in the last 8760 hours. HbA1C: No results for input(s): HGBA1C in the last 72 hours. CBG: No results for input(s): GLUCAP in the last  168 hours. Lipid Profile: No results for input(s): CHOL, HDL, LDLCALC, TRIG, CHOLHDL, LDLDIRECT in the last 72 hours. Thyroid Function Tests: No results for input(s): TSH, T4TOTAL, FREET4, T3FREE, THYROIDAB in the last 72 hours. Anemia Panel: No results for input(s): VITAMINB12, FOLATE, FERRITIN, TIBC, IRON, RETICCTPCT in the last 72 hours. Urine analysis:    Component Value Date/Time   COLORURINE YELLOW 02/14/2016 1614   APPEARANCEUR CLOUDY (A) 02/14/2016 1614   LABSPEC 1.014 02/14/2016 1614   PHURINE 6.0 02/14/2016 1614   GLUCOSEU NEGATIVE 02/14/2016 1614   HGBUR NEGATIVE 02/14/2016 1614   BILIRUBINUR NEGATIVE 02/14/2016 1614   KETONESUR NEGATIVE 02/14/2016 1614   PROTEINUR NEGATIVE 02/14/2016 1614   UROBILINOGEN 0.2 02/02/2015 2307   NITRITE POSITIVE (A) 02/14/2016 1614   LEUKOCYTESUR LARGE (A) 02/14/2016 1614   Sepsis Labs: @LABRCNTIP (procalcitonin:4,lacticidven:4) )No results found for this or any previous visit (from the past 240 hour(s)).     UA  evidence of UTI    No results found for: HGBA1C  Estimated Creatinine Clearance: 86 mL/min (by C-G formula based on SCr of 0.95 mg/dL).  BNP (last 3 results) No results for input(s): PROBNP in the last 8760 hours.   ECG REPORT  Independently reviewed Rate: 85  Rhythm: NSR ST&T Change: No acute ischemic changes   QTC 452  Filed Weights   02/14/16 1426  Weight: 95.3 kg (210 lb)     Cultures:    Component Value Date/Time   SDES URINE, CATHETERIZED 02/02/2015 2307   SPECREQUEST NONE 02/02/2015 2307   CULT  02/02/2015 2307    NO GROWTH 1 DAY Performed at [facility] 02/04/2015 FINAL 02/02/2015 2307     Radiological Exams on Admission: Ct Head Wo Contrast  Result Date: 02/14/2016 CLINICAL DATA:  Patient fell today with loss of consciousness. Fell on concrete bowel hitting vertex of the head. Headache. EXAM: CT HEAD WITHOUT CONTRAST TECHNIQUE: Contiguous axial images were obtained from  the  base of the skull through the vertex without intravenous contrast. COMPARISON:  None. FINDINGS: BRAIN: The ventricles and sulci are normal for age. Minimal small vessel ischemic disease of periventricular white matter and centrum semiovale, more so on the right. No intraparenchymal hemorrhage, mass effect nor midline shift. No acute large vascular territory infarcts. No abnormal extra-axial fluid collections. Basal cisterns are patent. VASCULAR: Moderate calcific atherosclerosis of the carotid siphons. SKULL: No skull fracture. Slight scalp soft tissue swelling near the vertex of the skull posteriorly. SINUSES/ORBITS: The mastoid air-cells and included paranasal sinuses are well-aerated.The included ocular globes and orbital contents are non-suspicious. OTHER: None. IMPRESSION: Mild soft tissue swelling of the scalp near the vertex of the skull. No acute osseous abnormality. No acute intracranial abnormality. Electronically Signed   By: Ashley Royalty M.D.   On: 02/14/2016 17:37    Chart has been reviewed    Assessment/Plan   66 y.o. male with medical history significant of arrhythmias possible SVT, hypertension, HLD being admitted for syncope   Present on Admission:   . Syncope and collapse - in a setting of palpitations  will admit to telemetry appreciate cardiology consult. We'll Order echo gram and carotid Dopplers.   check orthostatics and  Rehydrate, cycle CE, may need to decreased Flomax back down if can tolerate . UTI (urinary tract infection) - switch to rocephin await result of urine culture . Hypokalemia -  we will replace . Hypertension - continue lisinopril hold hydrochlorothiazide . SVT (supraventricular tachycardia) ([place])  - presumed SVT based on clinical presentation but will need other studies to confirm. Monitor on telemetry. We'll need cardiology follow-up and long-term monitoring Head injury no evidence of intracranial bleeding on CT Other plan as per orders.  DVT  prophylaxis:   Lovenox     Code Status:  FULL CODE   as per patient    Family Communication:   Family   at  Bedside  plan of care was discussed with Son,  Wife   Disposition Plan:     To home once workup is complete and patient is stable                              Consults called: emailed cardiology    Admission status:     obs   Level of care   tele           I have spent a total of 56 min on this admission     Elyse Prevo 02/14/2016, 9:43 PM    [facility]  Pager [phone]   after 2 AM please page floor coverage PA If 7AM-7PM, please contact the day team taking care of the patient  [email or web address]  Password [email or web address]

## 2016-02-14 NOTE — ED Provider Notes (Signed)
[place] DEPT Provider Note   CSN: [id number] Arrival date & time: 02/14/16  1349     History   Chief Complaint Chief Complaint  Patient presents with  . Loss of Consciousness    HPI Edwin Levine is a 65 y.o. male. With a hx of HTN. HLD, and several years of intermittent palpitations, thought to be SVT, with history of prior success abating his intermittent palpitations with valsalva maneuvers. He states that years prior he wore an event monitor for about a month and states that they were unable to catch any of these episodes on the recording. He states that over the last few months these episodes have become more frequent and have intermittently been associated with some transient lightheadedness, but he has never had a syncopal episode, chest pain, shortness breath, nausea, vomiting.   Today the patient states that he was at work and had an episode of palpitations similar to previous but was unalleviated by Valsalva maneuvers. This precipitated a significant sensation of diaphoresis, lightheadedness and then he syncopized and struck his head on the concrete below. He had brief LOC and quickly awoke with no focal neurologic deficits noted. No post-ictal confusion, no reported seizure activity or incontinence or tongue biting. No hx of seizures.   He denies any further sensation of palpitations, currently. He denied any associated chest pain, SOB, nausea, vomiting.   HPI  Past Medical History:  Diagnosis Date  . GERD (gastroesophageal reflux disease)   . Hyperlipidemia   . Hypertension   . Palpitations     Patient Active Problem List   Diagnosis Date Noted  . Hypertension 02/14/2016  . SVT (supraventricular tachycardia) ([place]) 02/14/2016  . Syncope and collapse 02/14/2016  . UTI (urinary tract infection) 02/14/2016  . Hypokalemia 02/14/2016    Past Surgical History:  Procedure Laterality Date  . HERNIA REPAIR         Home Medications    Prior to  Admission medications   Medication Sig Start Date End Date Taking? Authorizing Provider  acetaminophen (TYLENOL) 325 MG tablet Take 650 mg by mouth every 6 (six) hours as needed for mild pain.   Yes Historical Provider, MD  aspirin 81 MG tablet Take 81 mg by mouth daily.   Yes Historical Provider, MD  Aspirin-Acetaminophen-Caffeine (GOODY HEADACHE PO) Take 1 packet by mouth daily as needed (headache).   Yes Historical Provider, MD  atorvastatin (LIPITOR) 40 MG tablet Take 40 mg by mouth daily.   Yes Historical Provider, MD  Cyanocobalamin (VITAMIN B-12 PO) Take 1 tablet by mouth daily.   Yes Historical Provider, MD  lisinopril-hydrochlorothiazide (PRINZIDE,ZESTORETIC) 20-25 MG per tablet Take 1 tablet by mouth daily.   Yes Historical Provider, MD  naproxen sodium (ANAPROX) 220 MG tablet Take 220 mg by mouth 2 (two) times daily as needed (for pain).    Yes Historical Provider, MD  Omega-3 Fatty Acids (FISH OIL) 1000 MG CAPS Take 1 capsule by mouth daily.    Yes Historical Provider, MD  S-Adenosylmethionine-B6-B12-FA (MOOD PLUS STRESS RELIEF PO) Take 1 tablet by mouth daily.   Yes Historical Provider, MD  tamsulosin (FLOMAX) 0.4 MG CAPS capsule Take 0.8 mg by mouth daily after supper.   Yes Historical Provider, MD  ciprofloxacin (CIPRO) 500 MG tablet Take 1 tablet (500 mg total) by mouth 2 (two) times daily. Patient not taking: Reported on 02/14/2016 02/03/15   Merryl Hacker, MD    Family History Family History  Problem Relation Age of Onset  . Heart  Problems Mother   . Aneurysm Father   . Diabetes Neg Hx   . Cancer Neg Hx   . Stroke Neg Hx     Social History Social History  Substance Use Topics  . Smoking status: Never Smoker  . Smokeless tobacco: Never Used  . Alcohol use No     Allergies   Review of patient's allergies indicates no known allergies.   Review of Systems Review of Systems  Constitutional: Positive for activity change and diaphoresis. Negative for chills and  fever.  HENT: Negative for congestion, rhinorrhea, sinus pressure and sneezing.   Respiratory: Negative for cough, chest tightness, shortness of breath and wheezing.   Cardiovascular: Positive for palpitations. Negative for chest pain.  Gastrointestinal: Positive for abdominal pain (suprapubic). Negative for nausea and vomiting.  Genitourinary: Positive for frequency and urgency. Negative for difficulty urinating, flank pain and hematuria.  Musculoskeletal: Negative for back pain and neck pain.  Skin: Positive for wound. Negative for rash.  Neurological: Positive for syncope, light-headedness and headaches (tenderness over scalp abrasion and hematoma). Negative for dizziness, seizures, facial asymmetry, speech difficulty, weakness and numbness.  All other systems reviewed and are negative.    Physical Exam Updated Vital Signs BP 137/81 (BP Location: Right Arm)   Pulse 66   Temp 98.9 F (37.2 C) (Oral)   Resp 18   Ht 5\' 9"  (1.753 m)   Wt 94.7 kg Comment: Scale C  SpO2 97%   BMI 30.82 kg/m   Physical Exam  Constitutional: He is oriented to person, place, and time. He appears well-developed and well-nourished. No distress.  HENT:  Head: Normocephalic. Head is with abrasion and with contusion.    Nose: Nose normal.  Mouth/Throat: Oropharynx is clear and moist.  Eyes: Conjunctivae and EOM are normal. Pupils are equal, round, and reactive to light.  Neck: Normal range of motion. Neck supple.  Cardiovascular: Normal rate, regular rhythm, normal heart sounds and intact distal pulses.   Pulmonary/Chest: Effort normal and breath sounds normal.  Abdominal: Soft. He exhibits no distension. There is tenderness (mild) in the suprapubic area. There is no CVA tenderness, no tenderness at McBurney's point and negative Murphy's sign.  Musculoskeletal: He exhibits no edema or tenderness.  Neurological: He is alert and oriented to person, place, and time. He has normal strength. No cranial nerve  deficit or sensory deficit. Coordination and gait normal. GCS eye subscore is 4. GCS verbal subscore is 5. GCS motor subscore is 6.  No focal neuro deficits noted, no dysmetria, normal gait  Skin: Skin is warm and dry. No rash noted. He is not diaphoretic.  Nursing note and vitals reviewed.    ED Treatments / Results  Labs (all labs ordered are listed, but only abnormal results are displayed) Labs Reviewed  BASIC METABOLIC PANEL - Abnormal; Notable for the following:       Result Value   Potassium 3.3 (*)    Glucose, Bld 110 (*)    All other components within normal limits  URINALYSIS, ROUTINE W REFLEX MICROSCOPIC (NOT AT [facility]) - Abnormal; Notable for the following:    APPearance CLOUDY (*)    Nitrite POSITIVE (*)    Leukocytes, UA LARGE (*)    All other components within normal limits  URINE MICROSCOPIC-ADD ON - Abnormal; Notable for the following:    Squamous Epithelial / LPF 0-5 (*)    Bacteria, UA MANY (*)    All other components within normal limits  URINE CULTURE  CBC  TROPONIN I  MAGNESIUM  PHOSPHORUS  TSH  COMPREHENSIVE METABOLIC PANEL  CBC  TROPONIN I  TROPONIN I  HEMOGLOBIN A1C  I-STAT TROPOININ, ED    EKG  EKG Interpretation  Date/Time:  Monday February 14 2016 14:21:57 EDT Ventricular Rate:  85 PR Interval:  192 QRS Duration: 94 QT Interval:  380 QTC Calculation: 452 R Axis:   48 Text Interpretation:  Normal sinus rhythm Normal ECG Confirmed by DELO  MD, DOUGLAS ([id number]) on 02/14/2016 5:02:57 PM       Radiology Dg Chest 2 View  Result Date: 02/14/2016 CLINICAL DATA:  Syncope. EXAM: CHEST  2 VIEW COMPARISON:  None FINDINGS: The heart size and mediastinal contours are within normal limits. Both lungs are clear. The visualized skeletal structures are unremarkable. IMPRESSION: No active cardiopulmonary disease. Electronically Signed   By: Kerby Moors M.D.   On: 02/14/2016 19:39   Ct Head Wo Contrast  Result Date: 02/14/2016 CLINICAL DATA:   Patient fell today with loss of consciousness. Fell on concrete bowel hitting vertex of the head. Headache. EXAM: CT HEAD WITHOUT CONTRAST TECHNIQUE: Contiguous axial images were obtained from the base of the skull through the vertex without intravenous contrast. COMPARISON:  None. FINDINGS: BRAIN: The ventricles and sulci are normal for age. Minimal small vessel ischemic disease of periventricular white matter and centrum semiovale, more so on the right. No intraparenchymal hemorrhage, mass effect nor midline shift. No acute large vascular territory infarcts. No abnormal extra-axial fluid collections. Basal cisterns are patent. VASCULAR: Moderate calcific atherosclerosis of the carotid siphons. SKULL: No skull fracture. Slight scalp soft tissue swelling near the vertex of the skull posteriorly. SINUSES/ORBITS: The mastoid air-cells and included paranasal sinuses are well-aerated.The included ocular globes and orbital contents are non-suspicious. OTHER: None. IMPRESSION: Mild soft tissue swelling of the scalp near the vertex of the skull. No acute osseous abnormality. No acute intracranial abnormality. Electronically Signed   By: Ashley Royalty M.D.   On: 02/14/2016 17:37    Procedures Procedures (including critical care time)  Medications Ordered in ED Medications  cefTRIAXone (ROCEPHIN) 1 g in dextrose 5 % 50 mL IVPB (not administered)  aspirin EC tablet 81 mg (not administered)  atorvastatin (LIPITOR) tablet 40 mg (not administered)  sodium chloride flush (NS) 0.9 % injection 3 mL (3 mLs Intravenous Given 02/14/16 2221)  acetaminophen (TYLENOL) tablet 650 mg (not administered)    Or  acetaminophen (TYLENOL) suppository 650 mg (not administered)  HYDROcodone-acetaminophen (NORCO/VICODIN) 5-325 MG per tablet 1-2 tablet (not administered)  ondansetron (ZOFRAN) tablet 4 mg (not administered)    Or  ondansetron (ZOFRAN) injection 4 mg (not administered)  enoxaparin (LOVENOX) injection 40 mg (40 mg  Subcutaneous Given 02/14/16 2238)  0.9 %  sodium chloride infusion ( Intravenous New Bag/Given 02/14/16 2238)  potassium chloride 10 mEq in 100 mL IVPB (10 mEq Intravenous Given 02/14/16 2238)  lisinopril (PRINIVIL,ZESTRIL) tablet 20 mg (not administered)  tamsulosin (FLOMAX) capsule 0.8 mg (0.8 mg Oral Given 02/14/16 2237)  cefTRIAXone (ROCEPHIN) 1 g in dextrose 5 % 50 mL IVPB (0 g Intravenous Stopped 02/14/16 2035)  potassium chloride SA (K-DUR,KLOR-CON) CR tablet 40 mEq (40 mEq Oral Given 02/14/16 1954)  sodium chloride 0.9 % bolus 1,000 mL (1,000 mLs Intravenous New Bag/Given 02/14/16 2005)     Initial Impression / Assessment and Plan / ED Course  I have reviewed the triage vital signs and the nursing notes.  Pertinent labs & imaging results that were available during my care of the patient  were reviewed by me and considered in my medical decision making (see chart for details).  Clinical Course   65 year old gentleman presents with syncope concerning for cardiac etiology. Exam as above with no focal neurologic deficits. CT head was done and showed no acute intracranial abnormality. Labs were drawn and showed mild hypokalemia at 3.3, given 40 meq PO, but otherwise reassuring with no leukocytosis, no anemia, negative troponin. Chest x-ray was read by myself and read by radiology showing no acute abnormalities.   EKG showed normal sinus rhythm with no evidence of arrhythmia or ischemia.   UA returned showing persistent UTI despite recent course of Cipro. Plan to start a course of keflex for treatment of this.   Given concerning history for probable cardiac etiology of his syncope the decision was made to admit for observation and further cardiac workup.  Admitting hospitalist requested starting rocephin for UTI, which was ordered.    Final Clinical Impressions(s) / ED Diagnoses   Final diagnoses:  Syncope and collapse  Scalp hematoma, initial encounter  Intermittent palpitations    Acute cystitis without hematuria    New Prescriptions Current Discharge Medication List       Zenovia Jarred, DO 02/15/16 [id number]    Veryl Speak, MD 02/15/16 [phone]

## 2016-02-14 NOTE — ED Notes (Signed)
Attempted report x 2 

## 2016-02-14 NOTE — ED Notes (Signed)
Rekha, RN, asked if we could hold pt for 20 mins.

## 2016-02-14 NOTE — ED Notes (Signed)
Patient transported to X-ray 

## 2016-02-14 NOTE — ED Notes (Signed)
Dr. Delo at bedside. 

## 2016-02-15 ENCOUNTER — Observation Stay (HOSPITAL_BASED_OUTPATIENT_CLINIC_OR_DEPARTMENT_OTHER): Payer: 59

## 2016-02-15 ENCOUNTER — Encounter (HOSPITAL_COMMUNITY): Payer: Self-pay | Admitting: Physician Assistant

## 2016-02-15 DIAGNOSIS — R350 Frequency of micturition: Secondary | ICD-10-CM

## 2016-02-15 DIAGNOSIS — R000 Tachycardia, unspecified: Secondary | ICD-10-CM

## 2016-02-15 DIAGNOSIS — I10 Essential (primary) hypertension: Secondary | ICD-10-CM | POA: Diagnosis not present

## 2016-02-15 DIAGNOSIS — N3000 Acute cystitis without hematuria: Secondary | ICD-10-CM | POA: Diagnosis not present

## 2016-02-15 DIAGNOSIS — R55 Syncope and collapse: Secondary | ICD-10-CM

## 2016-02-15 DIAGNOSIS — I471 Supraventricular tachycardia: Secondary | ICD-10-CM

## 2016-02-15 DIAGNOSIS — N401 Enlarged prostate with lower urinary tract symptoms: Secondary | ICD-10-CM | POA: Diagnosis not present

## 2016-02-15 LAB — VAS US CAROTID
LCCAPSYS: 110 cm/s
LEFT ECA DIAS: -17 cm/s
LEFT VERTEBRAL DIAS: -10 cm/s
LICADSYS: -128 cm/s
Left CCA dist dias: -19 cm/s
Left CCA dist sys: -101 cm/s
Left CCA prox dias: 16 cm/s
Left ICA dist dias: -43 cm/s
Left ICA prox dias: 18 cm/s
Left ICA prox sys: 68 cm/s
RCCADSYS: -96 cm/s
RCCAPDIAS: 19 cm/s
RIGHT ECA DIAS: -18 cm/s
RIGHT VERTEBRAL DIAS: 15 cm/s
Right CCA prox sys: 114 cm/s

## 2016-02-15 LAB — CBC
HCT: 39 % (ref 39.0–52.0)
Hemoglobin: 12.9 g/dL — ABNORMAL LOW (ref 13.0–17.0)
MCH: 31.3 pg (ref 26.0–34.0)
MCHC: 33.1 g/dL (ref 30.0–36.0)
MCV: 94.7 fL (ref 78.0–100.0)
PLATELETS: 200 10*3/uL (ref 150–400)
RBC: 4.12 MIL/uL — AB (ref 4.22–5.81)
RDW: 12.8 % (ref 11.5–15.5)
WBC: 8.7 10*3/uL (ref 4.0–10.5)

## 2016-02-15 LAB — TSH: TSH: 1.744 u[IU]/mL (ref 0.350–4.500)

## 2016-02-15 LAB — COMPREHENSIVE METABOLIC PANEL
ALK PHOS: 77 U/L (ref 38–126)
ALT: 30 U/L (ref 17–63)
AST: 32 U/L (ref 15–41)
Albumin: 3.8 g/dL (ref 3.5–5.0)
Anion gap: 8 (ref 5–15)
BUN: 11 mg/dL (ref 6–20)
CALCIUM: 9.2 mg/dL (ref 8.9–10.3)
CO2: 26 mmol/L (ref 22–32)
CREATININE: 0.87 mg/dL (ref 0.61–1.24)
Chloride: 103 mmol/L (ref 101–111)
Glucose, Bld: 126 mg/dL — ABNORMAL HIGH (ref 65–99)
Potassium: 3.7 mmol/L (ref 3.5–5.1)
SODIUM: 137 mmol/L (ref 135–145)
Total Bilirubin: 1 mg/dL (ref 0.3–1.2)
Total Protein: 6.2 g/dL — ABNORMAL LOW (ref 6.5–8.1)

## 2016-02-15 LAB — MAGNESIUM: Magnesium: 1.9 mg/dL (ref 1.7–2.4)

## 2016-02-15 LAB — PHOSPHORUS: Phosphorus: 3.1 mg/dL (ref 2.5–4.6)

## 2016-02-15 LAB — TROPONIN I: Troponin I: <0.03 ng/mL (ref <0.03)

## 2016-02-15 MED ORDER — METOPROLOL TARTRATE 5 MG/5ML IV SOLN
5.0000 mg | Freq: Once | INTRAVENOUS | Status: AC
  Administered 2016-02-15: 5 mg via INTRAVENOUS
  Filled 2016-02-15: qty 5

## 2016-02-15 MED ORDER — METOPROLOL TARTRATE 25 MG PO TABS
25.0000 mg | ORAL_TABLET | Freq: Two times a day (BID) | ORAL | Status: DC
  Administered 2016-02-15 – 2016-02-17 (×5): 25 mg via ORAL
  Filled 2016-02-15 (×5): qty 1

## 2016-02-15 NOTE — Consult Note (Signed)
CARDIOLOGY CONSULT NOTE     Primary Care Physician: Pcp Not In System Referring Physician:  Admit Date: 02/14/2016  Reason for consultation: palpitations, syncope  Edwin Levine is a 65 y.o. male with a h/o HTN, HLD, GERD, palpitations w/out dx.  Palpitations have responded to ValSalva in the past. Wore 30 day monitor previously without evidence of arrhythmia.  Palpitations occurred for lat 5 years.  Have always responded to valsalva.  Performed valsalva yesterday and had episode of syncope.  Has not had syncope in the past.  Performed valsalva when standing, which he had never done in the past.  Palpitations have no exacerbating or alleviating factors. When he awoke from syncope felt well.  Had no warning of syncope. Thinks he was out for a few seconds.  Today, he denies symptoms of palpitations, chest pain, shortness of breath, orthopnea, PND, lower extremity edema, dizziness, presyncope, syncope, or neurologic sequela. The patient is tolerating medications without difficulties and is otherwise without complaint today.   Past Medical History:  Diagnosis Date  . GERD (gastroesophageal reflux disease)   . Hyperlipidemia   . Hypertension   . Palpitations    Past Surgical History:  Procedure Laterality Date  . HERNIA REPAIR      . aspirin EC  81 mg Oral Daily  . atorvastatin  40 mg Oral Daily  . cefTRIAXone (ROCEPHIN)  IV  1 g Intravenous Q24H  . enoxaparin (LOVENOX) injection  40 mg Subcutaneous Q24H  . metoprolol tartrate  25 mg Oral BID  . sodium chloride flush  3 mL Intravenous Q12H      No Known Allergies  Social History   Social History  . Marital status: Married    Spouse name: N/A  . Number of children: N/A  . Years of education: N/A   Occupational History  . 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 Smoking status: Never Smoker  . Smokeless tobacco: Never Used  . Alcohol use No  . Drug use: No  . Sexual activity: Not  on file   Other Topics Concern  . Not on file   Social History Narrative   Lives with his wife in [place], [place].    Family History  Problem Relation Age of Onset  . Heart Problems Mother   . Aneurysm Father   . Diabetes Neg Hx   . Cancer Neg Hx   . Stroke Neg Hx     ROS- All systems are reviewed and negative except as per the HPI above  Physical Exam: Telemetry: Vitals:   02/14/16 2030 02/14/16 2106 02/14/16 2211 02/15/16 1300  BP: 142/87 141/85 137/81 136/68  Pulse: 70 70 66 80  Resp: 16 25 18 18   Temp:   98.9 F (37.2 C) 98.3 F (36.8 C)  TempSrc:   Oral Oral  SpO2: 96% 97% 97% 95%  Weight:   208 lb 11.2 oz (94.7 kg)   Height:   5\' 9"  (1.753 m)     GEN- The patient is well appearing, alert and oriented x 3 today.   Head- normocephalic, atraumatic Eyes-  Sclera clear, conjunctiva pink Ears- hearing intact Oropharynx- clear Neck- supple, no JVP Lymph- no cervical lymphadenopathy Lungs- Clear to ausculation bilaterally, normal work of breathing Heart- Regular rate and rhythm, no murmurs, rubs or gallops, PMI not laterally displaced GI- soft, NT, ND, + BS Extremities- no clubbing, cyanosis, or edema MS- no significant deformity or atrophy Skin- no rash or lesion Psych- euthymic  mood, full affect Neuro- strength and sensation are intact  EKG: sinus rhythm  Labs:   Lab Results  Component Value Date   WBC 8.7 02/15/2016   HGB 12.9 (L) 02/15/2016   HCT 39.0 02/15/2016   MCV 94.7 02/15/2016   PLT 200 02/15/2016    Recent Labs Lab 02/15/16 0401  NA 137  K 3.7  CL 103  CO2 26  BUN 11  CREATININE 0.87  CALCIUM 9.2  PROT 6.2*  BILITOT 1.0  ALKPHOS 77  ALT 30  AST 32  GLUCOSE 126*   Lab Results  Component Value Date   TROPONINI <0.03 02/15/2016   No results found for: CHOL No results found for: HDL No results found for: LDLCALC No results found for: TRIG No results found for: CHOLHDL No results found for: LDLDIRECT    Radiology: No active  cardiopulmonary disease.  Echo: pending  ASSESSMENT AND PLAN:   1. Tachy palpitations: Has had episodes of tachycardia up to 177 with possible valsalva resolution.  Due to that, and history of syncope, would potentally place a 30 day monitor.  He has had 3 episodes in the last month and would thus possibly catch his arrhythmia without needing a LINQ. Could also do EP study and Edwin Levine discuss with the patient tomorrow to further determine the cause of his palpitations and syncope.  2. Syncope: Per history, peformed valsalva and had episode of syncope.  Likely a vagal reaction.  Would likely be able to drive as the episode occurred when using the valsalva and standing which he had not done in the past.   Edwin Levine Meredith Leeds, MD 02/15/2016  5:15 PM

## 2016-02-15 NOTE — Consult Note (Signed)
CARDIOLOGY CONSULT NOTE   Patient ID: Edwin Levine MRN: [id number] DOB/AGE: 1950-08-27 65 y.o.  Admit date: 02/14/2016  Primary Physician   Pcp Not In System Primary Cardiologist   Dr Marlou Porch 2013 Reason for Consultation   Syncope Requesting MD: Dr Quincy Simmonds  [id number] Edwin Levine is a 65 y.o. year old male with a history of HTN, HLD, GERD, palpitations w/out dx.  Palpitations have responded to ValSalva in the past, previous event monitor by Dr Marlou Porch did not catch them. He had nuclear stress test also, ok by his report.   Palpitations are not associated with exertion and are sudden in onset. His heart rate has been as high as 177 on his home BP machine. With ValSalva, his heart rate will return to normal and the palpitations will resolve. He has gotten dizzy and light-headed with them. He has lost vision once and lost hearing once. He has had 3 episodes in the last month, very unusual. He thinks he has had about 20 episodes since they started, about 4 years ago.   Yesterday, he was working on his truck and had onset of palpitations. He did the ValSalva, which he does not think he has ever done while standing up. He had no presyncope or warning at all and woke up on the floor. He thinks it was only a second or 2. He knew immediately who/where he was and the palpitations were gone. They have not returned since admission.    He never gets chest pain. He does not exercise, but his job is physical at times and keeps him very busy. He has job stress, but no recent increase in caffeine, no OTC meds. Other than with the palpitations, he never gets light-headed or dizzy.    Past Medical History:  Diagnosis Date  . GERD (gastroesophageal reflux disease)   . Hyperlipidemia   . Hypertension   . Palpitations     Past Surgical History:  Procedure Laterality Date  . HERNIA REPAIR      No Known Allergies  I have reviewed the patient's current medications . aspirin EC  81 mg Oral  Daily  . atorvastatin  40 mg Oral Daily  . cefTRIAXone (ROCEPHIN)  IV  1 g Intravenous Q24H  . enoxaparin (LOVENOX) injection  40 mg Subcutaneous Q24H  . lisinopril  20 mg Oral Daily  . sodium chloride flush  3 mL Intravenous Q12H     acetaminophen **OR** acetaminophen, HYDROcodone-acetaminophen, ondansetron **OR** ondansetron (ZOFRAN) IV  Prior to Admission medications   Medication Sig Start Date End Date Taking? Authorizing Provider  acetaminophen (TYLENOL) 325 MG tablet Take 650 mg by mouth every 6 (six) hours as needed for mild pain.   Yes Historical Provider, MD  aspirin 81 MG tablet Take 81 mg by mouth daily.   Yes Historical Provider, MD  Aspirin-Acetaminophen-Caffeine (GOODY HEADACHE PO) Take 1 packet by mouth daily as needed (headache).   Yes Historical Provider, MD  atorvastatin (LIPITOR) 40 MG tablet Take 40 mg by mouth daily.   Yes Historical Provider, MD  Cyanocobalamin (VITAMIN B-12 PO) Take 1 tablet by mouth daily.   Yes Historical Provider, MD  lisinopril-hydrochlorothiazide (PRINZIDE,ZESTORETIC) 20-25 MG per tablet Take 1 tablet by mouth daily.   Yes Historical Provider, MD  naproxen sodium (ANAPROX) 220 MG tablet Take 220 mg by mouth 2 (two) times daily as needed (for pain).    Yes Historical Provider, MD  Omega-3 Fatty Acids (FISH OIL) 1000 MG CAPS Take 1  capsule by mouth daily.    Yes Historical Provider, MD  S-Adenosylmethionine-B6-B12-FA (MOOD PLUS STRESS RELIEF PO) Take 1 tablet by mouth daily.   Yes Historical Provider, MD  tamsulosin (FLOMAX) 0.4 MG CAPS capsule Take 0.8 mg by mouth daily after supper.   Yes Historical Provider, MD  ciprofloxacin (CIPRO) 500 MG tablet Take 1 tablet (500 mg total) by mouth 2 (two) times daily. Patient not taking: Reported on 02/14/2016 02/03/15   Merryl Hacker, MD     Social History   Social History  . Marital status: Married    Spouse name: N/A  . Number of children: N/A  . Years of education: N/A   Occupational History    . 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 Smoking status: Never Smoker  . Smokeless tobacco: Never Used  . Alcohol use No  . Drug use: No  . Sexual activity: Not on file   Other Topics Concern  . Not on file   Social History Narrative   Lives with his wife in [place], [place].    Family Status  Relation Status  . Mother Deceased  . Father Deceased  . Neg Hx    Family History  Problem Relation Age of Onset  . Heart Problems Mother   . Aneurysm Father   . Diabetes Neg Hx   . Cancer Neg Hx   . Stroke Neg Hx      ROS:  Full 14 point review of systems complete and found to be negative unless listed above.  Physical Exam: Blood pressure 136/68, pulse 80, temperature 98.3 F (36.8 C), temperature source Oral, resp. rate 18, height 5\' 9"  (1.753 m), weight 208 lb 11.2 oz (94.7 kg), SpO2 95 %.  General: Well developed, well nourished, male in no acute distress Head: Eyes PERRLA, No xanthomas.   Normocephalic and atraumatic, oropharynx without edema or exudate. Dentition: fair Lungs: clear bilaterally Heart: HRRR S1 S2, no rub/gallop,no murmur. pulses are 2+ all 4 extrem.   Neck: No carotid bruits. No lymphadenopathy.  JVD not elevated Abdomen: Bowel sounds present, abdomen soft and non-tender without masses or hernias noted. Msk:  No spine or cva tenderness. No weakness, no joint deformities or effusions. Extremities: No clubbing or cyanosis. No edema.  Neuro: Alert and oriented X 3. No focal deficits noted. Psych:  Good affect, responds appropriately Skin: No rashes or lesions noted.  Labs:   Lab Results  Component Value Date   WBC 8.7 02/15/2016   HGB 12.9 (L) 02/15/2016   HCT 39.0 02/15/2016   MCV 94.7 02/15/2016   PLT 200 02/15/2016    Recent Labs Lab 02/15/16 0401  NA 137  K 3.7  CL 103  CO2 26  BUN 11  CREATININE 0.87  CALCIUM 9.2  PROT 6.2*  BILITOT 1.0  ALKPHOS 77  ALT 30  AST 32  GLUCOSE 126*  ALBUMIN 3.8    Magnesium  Date Value Ref Range Status  02/15/2016 1.9 1.7 - 2.4 mg/dL Final    Recent Labs  02/14/16 2247 02/15/16 0401 02/15/16 1010  TROPONINI <0.03 <0.03 <0.03    Recent Labs  02/14/16 1711  TROPIPOC 0.00   TSH  Date/Time Value Ref Range Status  02/15/2016 04:01 AM 1.744 0.350 - 4.500 uIU/mL Final    Comment:    Performed by a 3rd Generation assay with a functional sensitivity of <=0.01 uIU/mL.   Echo: ordered  ECG:  10/24 SR, rate 63, LVH No old  Cath: n/a  Carotid Dopplers: 02/15/2016 Preliminary results by tech: Bilateral carotid duplex complete. No hemodynamically significant stenosis seen bilateral carotid arteries. Small segment of calcified plaque in distal right CCA. Bilateral vertebral arteries demonstrate antegrade flow. Incidental findings: Left sided thyroid nodules, one appears heterogeneous with flow, two other hypoechoic nodules noted.  Radiology:  Dg Chest 2 View Result Date: 02/14/2016 CLINICAL DATA:  Syncope. EXAM: CHEST  2 VIEW COMPARISON:  None FINDINGS: The heart size and mediastinal contours are within normal limits. Both lungs are clear. The visualized skeletal structures are unremarkable. IMPRESSION: No active cardiopulmonary disease. Electronically Signed   By: Kerby Moors M.D.   On: 02/14/2016 19:39   Ct Head Wo Contrast Result Date: 02/14/2016 CLINICAL DATA:  Patient fell today with loss of consciousness. Fell on concrete bowel hitting vertex of the head. Headache. EXAM: CT HEAD WITHOUT CONTRAST TECHNIQUE: Contiguous axial images were obtained from the base of the skull through the vertex without intravenous contrast. COMPARISON:  None. FINDINGS: BRAIN: The ventricles and sulci are normal for age. Minimal small vessel ischemic disease of periventricular white matter and centrum semiovale, more so on the right. No intraparenchymal hemorrhage, mass effect nor midline shift. No acute large vascular territory infarcts. No abnormal  extra-axial fluid collections. Basal cisterns are patent. VASCULAR: Moderate calcific atherosclerosis of the carotid siphons. SKULL: No skull fracture. Slight scalp soft tissue swelling near the vertex of the skull posteriorly. SINUSES/ORBITS: The mastoid air-cells and included paranasal sinuses are well-aerated.The included ocular globes and orbital contents are non-suspicious. OTHER: None. IMPRESSION: Mild soft tissue swelling of the scalp near the vertex of the skull. No acute osseous abnormality. No acute intracranial abnormality. Electronically Signed   By: Ashley Royalty M.D.   On: 02/14/2016 17:37    ASSESSMENT AND PLAN:   The patient was seen today by Dr [place], the patient evaluated and the data reviewed.   1. Syncope and collapse:  - happened in the setting of rapid palpitations and ValSalva while standing - it was brief, no prodrome or sequelae - long hx palpitations but no diagnosis, never caught - consider LINQ - add metoprolol  2.   Hypertension - hold lisinopril/HCTZ - add BB  Otherwise, per IM Active Problems:   SVT (supraventricular tachycardia) (HCC)   UTI (urinary tract infection)   Hypokalemia   Acute cystitis without hematuria   Benign prostatic hyperplasia with urinary frequency   Signed: Lenoard Aden 02/15/2016 2:34 PM Beeper [id number]  Co-Sign MD Patient seen and examined and history reviewed. Agree with above findings and plan. 65 yo WM presents for evaluation of syncope. He has a long history of tachy palpitations. Evaluated by Dr. Marlou Porch 5 years ago with nuclear stress test and event monitor which were unrevealing. 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 He reports about 20 episodes of tachycardia over the last 5 years. HR up as high as 177. Lasts 5-20 minutes. Usually breaks with valsalva maneuver. Today took Goody's powder. About 30 minutes later developed tachycardia. Tried valsalva while standing and passed out cold. No chest  pain or SOB. Feels well now. On exam VSS.  Lungs clear. No gallop or murmur  Ecg, labs, CXR reviewed personally  Impression: Syncope. Suspect related to tachy arrhythmia with possible post termination pause. Recommendation: will check Echo.  Avoid cardiac stimulants esp. Caffeine (Goodys powder) Discussed monitor options including 30 day event versus implantable loop recorder. Given current events and sporadic nature of symptoms I would consider a loop  recorder. 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  Braylyn Kalter [place], [place] 02/15/2016 3:15 PM

## 2016-02-15 NOTE — Progress Notes (Signed)
Pt is retaining urine (had a complain of not emptying bladder completely), bladder scan shows more than 380cc, called MD for order for in/out cath right now, will continue to monitor

## 2016-02-15 NOTE — Progress Notes (Signed)
PROGRESS NOTE [facility]   Edwin Levine   [id number] DOB: 1951-04-12  DOA: 02/14/2016 PCP: Pcp Not In System   Brief Narrative:  Edwin Levine is a 65 y.o. male with medical history of hypertension, HLD and ? arrhythmia ?SVT per patient he has been using Valsalva maneuvers when he is feeling lightheaded which most of the time resolve his symptoms. This episode he tried to use valsalva but did not work and fainted. Patient has had 3 episodes in the past couple of weeks. Patient LOC for few seconds the everything was back to normal, no chest pain or palpitations. Had cardiac work up 5 yrs ago, per family was negative. Patient had an increase in his Flomax about 3 month ago. Patient also report high BP readings at home > 160's. Patient admitted for syncope eval.    Subjective: Patient seen and examined, have no complaints, tele no events. Denies dizziness, chest pain, SOB and palpitations.   Assessment & Plan:  Syncope - Arrhythmia vs dehydration vs medication (flomax and HCTZ) - CT head negative -Orthostatics  -Follow up carotid doppler -Follow up ECHO  -Continue Tele  -May need a 1 month cardiac monitor upon discharge  -Cardio consult appreciated  -Hold Flomax and HCTZ  HTN - stable  -on Lisinopril only - HCTZ on hold  -Monitor BP   UTI - was diagnosed by PCP started on cipro which was switched to rocephin when admitted  -Continue abx  -Follow up urine cx   BPH - Stable  -Hold Flomax   DVT prophylaxis: Lovenox Code Status: Full Family Communication: Plan discussed with family at bedside Disposition Plan: Possible d/c in 24-48 hrs if work up negative.  Consultants:   Cardiology   Procedures:   Carodid doppler  ECHO  Antimicrobials:  Rocephin 10/23   Objective: Vitals:   02/14/16 2030 02/14/16 2106 02/14/16 2211 02/15/16 1300  BP: 142/87 141/85 137/81 136/68  Pulse: 70 70 66 80  Resp: 16 25 18 18   Temp:   98.9 F (37.2 C) 98.3 F  (36.8 C)  TempSrc:   Oral Oral  SpO2: 96% 97% 97% 95%  Weight:   94.7 kg (208 lb 11.2 oz)   Height:   5\' 9"  (1.753 m)     Intake/Output Summary (Last 24 hours) at 02/15/16 1355 Last data filed at 02/15/16 1306  Gross per 24 hour  Intake          1136.67 ml  Output             1225 ml  Net           -88.33 ml   Filed Weights   02/14/16 1426 02/14/16 2211  Weight: 95.3 kg (210 lb) 94.7 kg (208 lb 11.2 oz)    Examination:  General exam: Appears calm and comfortable  HEENT: AC/AT, PERRLA, OP moist and clear Respiratory system: Clear to auscultation. No wheezes,crackle or rhonchi Cardiovascular system: S1 & S2 heard, RRR. No JVD, murmurs, rubs or gallops Gastrointestinal system: Abdomen is nondistended, soft and nontender. No organomegaly or masses felt. Normal bowel sounds heard. Central nervous system: Alert and oriented. No focal neurological deficits. Extremities: No pedal edema. Symmetric, strength 5/5   Skin: No rashes, lesions or ulcers Psychiatry: Judgement and insight appear normal. Mood & affect appropriate.    Data Reviewed: I have personally reviewed following labs and imaging studies  CBC:  Recent Labs Lab 02/14/16 1428 02/15/16 0401  WBC 8.1 8.7  HGB 13.9 12.9*  HCT 40.3 39.0  MCV 93.3 94.7  PLT 205 [id number]   Basic Metabolic Panel:  Recent Labs Lab 02/14/16 1428 02/15/16 0401  NA 138 137  K 3.3* 3.7  CL 103 103  CO2 25 26  GLUCOSE 110* 126*  BUN 16 11  CREATININE 0.95 0.87  CALCIUM 9.7 9.2  MG  --  1.9  PHOS  --  3.1   GFR: Estimated Creatinine Clearance: 98.7 mL/min (by C-G formula based on SCr of 0.87 mg/dL). Liver Function Tests:  Recent Labs Lab 02/15/16 0401  AST 32  ALT 30  ALKPHOS 77  BILITOT 1.0  PROT 6.2*  ALBUMIN 3.8   No results for input(s): LIPASE, AMYLASE in the last 168 hours. No results for input(s): AMMONIA in the last 168 hours. Coagulation Profile: No results for input(s): INR, PROTIME in the last 168  hours. Cardiac Enzymes:  Recent Labs Lab 02/14/16 2247 02/15/16 0401 02/15/16 1010  TROPONINI <0.03 <0.03 <0.03   BNP (last 3 results) No results for input(s): PROBNP in the last 8760 hours. HbA1C: No results for input(s): HGBA1C in the last 72 hours. CBG: No results for input(s): GLUCAP in the last 168 hours. Lipid Profile: No results for input(s): CHOL, HDL, LDLCALC, TRIG, CHOLHDL, LDLDIRECT in the last 72 hours. Thyroid Function Tests:  Recent Labs  02/15/16 0401  TSH 1.744   Anemia Panel: No results for input(s): VITAMINB12, FOLATE, FERRITIN, TIBC, IRON, RETICCTPCT in the last 72 hours. Sepsis Labs: No results for input(s): PROCALCITON, LATICACIDVEN in the last 168 hours.  No results found for this or any previous visit (from the past 240 hour(s)).     Radiology Studies: Dg Chest 2 View  Result Date: 02/14/2016 CLINICAL DATA:  Syncope. EXAM: CHEST  2 VIEW COMPARISON:  None FINDINGS: The heart size and mediastinal contours are within normal limits. Both lungs are clear. The visualized skeletal structures are unremarkable. IMPRESSION: No active cardiopulmonary disease. Electronically Signed   By: Kerby Moors M.D.   On: 02/14/2016 19:39   Ct Head Wo Contrast  Result Date: 02/14/2016 CLINICAL DATA:  Patient fell today with loss of consciousness. Fell on concrete bowel hitting vertex of the head. Headache. EXAM: CT HEAD WITHOUT CONTRAST TECHNIQUE: Contiguous axial images were obtained from the base of the skull through the vertex without intravenous contrast. COMPARISON:  None. FINDINGS: BRAIN: The ventricles and sulci are normal for age. Minimal small vessel ischemic disease of periventricular white matter and centrum semiovale, more so on the right. No intraparenchymal hemorrhage, mass effect nor midline shift. No acute large vascular territory infarcts. No abnormal extra-axial fluid collections. Basal cisterns are patent. VASCULAR: Moderate calcific atherosclerosis of  the carotid siphons. SKULL: No skull fracture. Slight scalp soft tissue swelling near the vertex of the skull posteriorly. SINUSES/ORBITS: The mastoid air-cells and included paranasal sinuses are well-aerated.The included ocular globes and orbital contents are non-suspicious. OTHER: None. IMPRESSION: Mild soft tissue swelling of the scalp near the vertex of the skull. No acute osseous abnormality. No acute intracranial abnormality. Electronically Signed   By: Ashley Royalty M.D.   On: 02/14/2016 17:37    Scheduled Meds: . aspirin EC  81 mg Oral Daily  . atorvastatin  40 mg Oral Daily  . cefTRIAXone (ROCEPHIN)  IV  1 g Intravenous Q24H  . enoxaparin (LOVENOX) injection  40 mg Subcutaneous Q24H  . lisinopril  20 mg Oral Daily  . sodium chloride flush  3 mL Intravenous Q12H  . tamsulosin  0.8 mg Oral  QPC supper   Continuous Infusions:    LOS: 0 days    Chipper Oman, MD [facility] Pager [phone]  If 7PM-7AM, please contact night-coverage [email or web address] Password [email or web address] 02/15/2016, 1:55 PM

## 2016-02-15 NOTE — Progress Notes (Addendum)
Preliminary results by tech: Bilateral carotid duplex complete.  1-39% stenosis bilateral ICA's. Bilateral vertebral arteries demonstrate antegrade flow. Incidental findings: Left sided thyroid nodules, one appears heterogeneous with flow, two other hypoechoic nodules noted. Lita Mains- RDMS, RVT 10:01 AM  02/15/2016   .

## 2016-02-16 ENCOUNTER — Encounter (HOSPITAL_COMMUNITY)
Admission: EM | Disposition: A | Discharge status: Home / Self Care | Payer: Self-pay | Source: Home / Self Care | Attending: Emergency Medicine

## 2016-02-16 ENCOUNTER — Observation Stay (HOSPITAL_BASED_OUTPATIENT_CLINIC_OR_DEPARTMENT_OTHER): Payer: 59

## 2016-02-16 ENCOUNTER — Encounter (HOSPITAL_COMMUNITY): Payer: Self-pay | Admitting: Internal Medicine

## 2016-02-16 DIAGNOSIS — N3000 Acute cystitis without hematuria: Secondary | ICD-10-CM

## 2016-02-16 DIAGNOSIS — N401 Enlarged prostate with lower urinary tract symptoms: Secondary | ICD-10-CM

## 2016-02-16 DIAGNOSIS — E876 Hypokalemia: Secondary | ICD-10-CM | POA: Diagnosis not present

## 2016-02-16 DIAGNOSIS — R55 Syncope and collapse: Secondary | ICD-10-CM

## 2016-02-16 DIAGNOSIS — I10 Essential (primary) hypertension: Secondary | ICD-10-CM | POA: Diagnosis not present

## 2016-02-16 DIAGNOSIS — R350 Frequency of micturition: Secondary | ICD-10-CM

## 2016-02-16 DIAGNOSIS — I471 Supraventricular tachycardia: Secondary | ICD-10-CM | POA: Diagnosis not present

## 2016-02-16 DIAGNOSIS — I4892 Unspecified atrial flutter: Secondary | ICD-10-CM | POA: Diagnosis not present

## 2016-02-16 HISTORY — PX: ELECTROPHYSIOLOGIC STUDY: SHX172A

## 2016-02-16 LAB — ECHOCARDIOGRAM COMPLETE
CHL CUP MV DEC (S): 289
EERAT: 4.67
EWDT: 289 ms
FS: 32 % (ref 28–44)
Height: 69 in
IV/PV OW: 1.15
LA ID, A-P, ES: 32 mm
LA diam end sys: 32 mm
LA vol A4C: 82.9 ml
LA vol: 75.2 mL
LADIAMINDEX: 1.52 cm/m2
LAVOLIN: 35.6 mL/m2
LV E/e' medial: 4.67
LV PW d: 12.4 mm — AB (ref 0.6–1.1)
LV TDI E'LATERAL: 15.6
LV TDI E'MEDIAL: 9.57
LVEEAVG: 4.67
LVELAT: 15.6 cm/s
LVOT area: 3.8 cm2
LVOTD: 22 mm
Lateral S' vel: 11.9 cm/s
MV Peak grad: 2 mmHg
MV pk A vel: 66.9 m/s
MV pk E vel: 72.8 m/s
Weight: 3377.6 oz

## 2016-02-16 LAB — HEMOGLOBIN A1C
HEMOGLOBIN A1C: 5.7 % — AB (ref 4.8–5.6)
Mean Plasma Glucose: 117 mg/dL

## 2016-02-16 SURGERY — A-FLUTTER/A-TACH/SVT ABLATION

## 2016-02-16 MED ORDER — BUPIVACAINE HCL (PF) 0.25 % IJ SOLN
INTRAMUSCULAR | Status: DC | PRN
  Administered 2016-02-16: 45 mL

## 2016-02-16 MED ORDER — BUPIVACAINE HCL (PF) 0.25 % IJ SOLN
INTRAMUSCULAR | Status: AC
  Filled 2016-02-16: qty 60

## 2016-02-16 MED ORDER — MIDAZOLAM HCL 5 MG/5ML IJ SOLN
INTRAMUSCULAR | Status: AC
  Filled 2016-02-16: qty 5

## 2016-02-16 MED ORDER — MIDAZOLAM HCL 5 MG/5ML IJ SOLN
INTRAMUSCULAR | Status: DC | PRN
  Administered 2016-02-16 (×4): 1 mg via INTRAVENOUS
  Administered 2016-02-16 (×2): 2 mg via INTRAVENOUS
  Administered 2016-02-16 (×4): 1 mg via INTRAVENOUS

## 2016-02-16 MED ORDER — ISOPROTERENOL HCL 0.2 MG/ML IJ SOLN
INTRAMUSCULAR | Status: AC
  Filled 2016-02-16: qty 5

## 2016-02-16 MED ORDER — ISOPROTERENOL HCL 0.2 MG/ML IJ SOLN
INTRAVENOUS | Status: DC | PRN
  Administered 2016-02-16: 4 ug/min via INTRAVENOUS

## 2016-02-16 MED ORDER — INFLUENZA VAC SPLIT QUAD 0.5 ML IM SUSY
0.5000 mL | PREFILLED_SYRINGE | INTRAMUSCULAR | Status: AC
  Administered 2016-02-17: 0.5 mL via INTRAMUSCULAR
  Filled 2016-02-16: qty 0.5

## 2016-02-16 MED ORDER — FENTANYL CITRATE (PF) 100 MCG/2ML IJ SOLN
INTRAMUSCULAR | Status: DC | PRN
  Administered 2016-02-16 (×2): 12.5 ug via INTRAVENOUS
  Administered 2016-02-16: 25 ug via INTRAVENOUS
  Administered 2016-02-16 (×4): 12.5 ug via INTRAVENOUS
  Administered 2016-02-16: 25 ug via INTRAVENOUS
  Administered 2016-02-16: 12.5 ug via INTRAVENOUS

## 2016-02-16 MED ORDER — FENTANYL CITRATE (PF) 100 MCG/2ML IJ SOLN
INTRAMUSCULAR | Status: AC
  Filled 2016-02-16: qty 2

## 2016-02-16 MED ORDER — SODIUM CHLORIDE 0.9 % IV SOLN: 250.0000 mL | INTRAVENOUS | Status: DC | PRN

## 2016-02-16 MED ORDER — SODIUM CHLORIDE 0.9% FLUSH: 3.0000 mL | INTRAVENOUS | Status: DC | PRN

## 2016-02-16 MED ORDER — ONDANSETRON HCL 4 MG/2ML IJ SOLN: 4.0000 mg | Freq: Four times a day (QID) | INTRAMUSCULAR | Status: DC | PRN

## 2016-02-16 MED ORDER — SODIUM CHLORIDE 0.9 % IV SOLN: INTRAVENOUS | Status: DC

## 2016-02-16 MED ORDER — ACETAMINOPHEN 325 MG PO TABS: 650.0000 mg | ORAL_TABLET | ORAL | Status: DC | PRN

## 2016-02-16 MED ORDER — SODIUM CHLORIDE 0.9% FLUSH
3.0000 mL | Freq: Two times a day (BID) | INTRAVENOUS | Status: DC
  Administered 2016-02-16: 3 mL via INTRAVENOUS

## 2016-02-16 MED ORDER — HEPARIN (PORCINE) IN NACL 2-0.9 UNIT/ML-% IJ SOLN
INTRAMUSCULAR | Status: DC | PRN
  Administered 2016-02-16: 500 mL

## 2016-02-16 MED ORDER — HEPARIN (PORCINE) IN NACL 2-0.9 UNIT/ML-% IJ SOLN
INTRAMUSCULAR | Status: AC
  Filled 2016-02-16: qty 500

## 2016-02-16 MED ORDER — TAMSULOSIN HCL 0.4 MG PO CAPS
0.8000 mg | ORAL_CAPSULE | Freq: Every day | ORAL | Status: DC
  Administered 2016-02-16: 0.8 mg via ORAL
  Filled 2016-02-16: qty 2

## 2016-02-16 SURGICAL SUPPLY — 12 items
BAG SNAP BAND KOVER 36X36 (MISCELLANEOUS) ×1 IMPLANT
CATH BLAZERPRIME XP (ABLATOR) ×1 IMPLANT
CATH CELSIUS THERM D CV 7F (ABLATOR) ×1 IMPLANT
CATH HEX JOS 2-5-2 65CM 6F REP (CATHETERS) ×1 IMPLANT
CATH JOSEPHSON QUAD-ALLRED 6FR (CATHETERS) ×2 IMPLANT
PACK EP LATEX FREE (CUSTOM PROCEDURE TRAY) ×2
PACK EP LF (CUSTOM PROCEDURE TRAY) IMPLANT
PAD DEFIB LIFELINK (PAD) ×1 IMPLANT
SHEATH PINNACLE 6F 10CM (SHEATH) ×2 IMPLANT
SHEATH PINNACLE 7F 10CM (SHEATH) ×1 IMPLANT
SHEATH PINNACLE 8F 10CM (SHEATH) ×1 IMPLANT
SHIELD RADPAD SCOOP 12X17 (MISCELLANEOUS) ×1 IMPLANT

## 2016-02-16 NOTE — Progress Notes (Signed)
Patient refused to let us bladder scan him as he stated he did not feel like he is retaining.  Flomax restarted per MD and will continue to monitor.

## 2016-02-16 NOTE — Progress Notes (Signed)
PROGRESS NOTE  Edwin Levine [id number] DOB: Sep 09, 1950 DOA: 02/14/2016 PCP: Pcp Not In System  Brief History:  64 y.o.malewith medical history of hypertension, HLD and ? arrhythmia ?SVT per patient he has been using Valsalva maneuvers when he is feeling lightheaded which most of the time resolve his symptoms. This episode he tried to use valsalva but did not work and fainted. Patient has had 3 episodes in the past couple of weeks. Patient LOC for few seconds the everything was back to normal, no chest pain or palpitations. Had cardiac work up 5 yrs ago, per family was negative. Patient had an increase in his Flomax about 3 month ago. Patient also report high BP readings at home > 160's. Patient admitted for syncope eval.  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 -Cardio consult appreciated-->catheter ablation on 10/25  SVT -s/p catheter ablation on 02/16/16  HTN - stable  -continue metoprolol tartrate -Monitor BP   UTI -  -Continue abx  -Follow up urine cx   BPH  -restart flomax as he had urine retention which is contributing to UTI  Impaired glucose tolerance -continue lifestyle modification -02/15/2016 hemoglobin A1c 5.7    Disposition Plan:   Home 10/26 if stable  Family Communication:   Wife, son, daughter updated at bedside 10/25  Consultants:    Code Status:  FULL / DNR  DVT Prophylaxis:  [place] Heparin / [place] Lovenox   Procedures: As Listed in Progress Note Above  Antibiotics: None    Subjective: Patient denies fevers, chills, headache, chest pain, dyspnea, nausea, vomiting, diarrhea, abdominal pain, dysuria, hematuria, hematochezia, and melena.   Objective: Vitals:   02/16/16 1430 02/16/16 1500 02/16/16 1600 02/16/16 1720  BP: 119/66 112/62 123/70 106/72  Pulse: (!) 55  (!) 54 (!) 55 61  Resp:      Temp:      TempSrc:      SpO2:      Weight:      Height:        Intake/Output Summary (Last 24 hours) at 02/16/16 1838 Last data filed at 02/16/16 0954  Gross per 24 hour  Intake              243 ml  Output             1225 ml  Net             -982 ml   Weight change: 0.499 kg (1 lb 1.6 oz) Exam:   General:  Pt is alert, follows commands appropriately, not in acute distress  HEENT: No icterus, No thrush, No neck mass, [place]/AT  Cardiovascular: RRR, S1/S2, no rubs, no gallops  Respiratory: CTA bilaterally, no wheezing, no crackles, no rhonchi  Abdomen: Soft/+BS, non tender, non distended, no guarding  Extremities: No edema, No lymphangitis, No petechiae, No rashes, no synovitis   Data Reviewed: I have personally reviewed following labs and imaging studies Basic Metabolic Panel:  Recent Labs Lab 02/14/16 1428 02/15/16 0401  NA 138 137  K 3.3* 3.7  CL 103 103  CO2 25 26  GLUCOSE 110* 126*  BUN 16 11  CREATININE 0.95 0.87  CALCIUM 9.7 9.2  MG  --  1.9  PHOS  --  3.1   Liver Function Tests:  Recent Labs Lab 02/15/16 0401  AST 32  ALT 30  ALKPHOS 77  BILITOT 1.0  PROT 6.2*  ALBUMIN 3.8   No results for input(s): LIPASE, AMYLASE in the last 168 hours. No results for input(s): AMMONIA in the last 168 hours. Coagulation Profile: No results for input(s): INR, PROTIME in the last 168 hours. CBC:  Recent Labs Lab 02/14/16 1428 02/15/16 0401  WBC 8.1 8.7  HGB 13.9 12.9*  HCT 40.3 39.0  MCV 93.3 94.7  PLT 205 200   Cardiac Enzymes:  Recent Labs Lab 02/14/16 2247 02/15/16 0401 02/15/16 1010  TROPONINI <0.03 <0.03 <0.03   BNP: Invalid input(s): POCBNP CBG: No results for input(s): GLUCAP in the last 168 hours. HbA1C:  Recent Labs  02/15/16 0401  HGBA1C 5.7*   Urine analysis:    Component Value Date/Time   COLORURINE YELLOW 02/14/2016 1614   APPEARANCEUR CLOUDY (A) 02/14/2016 1614   LABSPEC 1.014  02/14/2016 1614   PHURINE 6.0 02/14/2016 1614   GLUCOSEU NEGATIVE 02/14/2016 1614   HGBUR NEGATIVE 02/14/2016 1614   BILIRUBINUR NEGATIVE 02/14/2016 1614   KETONESUR NEGATIVE 02/14/2016 1614   PROTEINUR NEGATIVE 02/14/2016 1614   UROBILINOGEN 0.2 02/02/2015 2307   NITRITE POSITIVE (A) 02/14/2016 1614   LEUKOCYTESUR LARGE (A) 02/14/2016 1614   Sepsis Labs: @LABRCNTIP (procalcitonin:4,lacticidven:4) ) Recent Results (from the past 240 hour(s))  Urine culture     Status: Abnormal (Preliminary result)   Collection Time: 02/14/16  5:37 PM  Result Value Ref Range Status   Specimen Description URINE, RANDOM  Final   Special Requests NONE  Final   Culture >=100,000 COLONIES/mL KLEBSIELLA PNEUMONIAE (A)  Final   Report Status PENDING  Incomplete     Scheduled Meds: . aspirin EC  81 mg Oral Daily  . atorvastatin  40 mg Oral Daily  . cefTRIAXone (ROCEPHIN)  IV  1 g Intravenous Q24H  . enoxaparin (LOVENOX) injection  40 mg Subcutaneous Q24H  . [START ON 02/17/2016] Influenza vac split quadrivalent PF  0.5 mL Intramuscular Tomorrow-1000  . metoprolol tartrate  25 mg Oral BID  . sodium chloride flush  3 mL Intravenous Q12H  . sodium chloride flush  3 mL Intravenous Q12H  . tamsulosin  0.8 mg Oral QPC supper   Continuous Infusions:   Procedures/Studies: Dg Chest 2 View  Result Date: 02/14/2016 CLINICAL DATA:  Syncope. EXAM: CHEST  2 VIEW COMPARISON:  None FINDINGS: The heart size and mediastinal contours are within normal limits. Both lungs are clear. The visualized skeletal structures are unremarkable. IMPRESSION: No active cardiopulmonary disease. Electronically Signed   By: Kerby Moors M.D.   On: 02/14/2016 19:39   Ct Head Wo Contrast  Result Date: 02/14/2016 CLINICAL DATA:  Patient fell today with loss of consciousness. Fell on concrete bowel hitting vertex of the head. Headache. EXAM: CT HEAD WITHOUT CONTRAST TECHNIQUE: Contiguous axial images were obtained from the base of the  skull through the vertex without intravenous contrast. COMPARISON:  None. FINDINGS: BRAIN: The ventricles and sulci are normal for age. Minimal small vessel ischemic disease of periventricular white matter and centrum semiovale, more so on the right. No intraparenchymal hemorrhage, mass effect nor midline shift. No acute large vascular territory infarcts. No abnormal extra-axial fluid collections. Basal cisterns are patent. VASCULAR: Moderate calcific atherosclerosis of the carotid siphons. SKULL: No skull fracture. Slight scalp soft tissue swelling near the vertex of the skull posteriorly. SINUSES/ORBITS: The mastoid air-cells and included paranasal sinuses are well-aerated.The included ocular globes and orbital contents are non-suspicious. OTHER: None. IMPRESSION: Mild  soft tissue swelling of the scalp near the vertex of the skull. No acute osseous abnormality. No acute intracranial abnormality. Electronically Signed   By: Ashley Royalty M.D.   On: 02/14/2016 17:37    Takuya Lariccia, DO  [facility] Pager [phone]  If 7PM-7AM, please contact night-coverage [email or web address] Password [email or web address] 02/16/2016, 6:38 PM   LOS: 0 days

## 2016-02-16 NOTE — Progress Notes (Signed)
Site area: Rt IJ Site Prior to Removal:  Level 0 Pressure Applied For: 10 min Manual:   Yes Patient Status During Pull:  A/O Post Pull Site:  Level 0 Post Pull Instructions Given: Yes  Post Pull Pulses Present:  Dressing Applied:  2x2 and a tegaderm Bedrest begins @ 13:15:00 Comments: Rt ij site is unremarkable. Dressing is CDI  Site area:  rt groin Venous x3 Site Prior to Removal:  Level 0 Pressure Applied For 10 MINUTES   Minutes Beginning at : 13:00:00 Manual:   Yes Patient Status During Pull:A/O Post Pull Groin Site:  Level 0 Post Pull Instructions Given:  Yes, Pt understands port instructions Post Pull Pulses Present:  Rt dp2+ Dressing Applied:  tegaderm and a 4x4  Bedrest Begins:13:15:00 Comments: Pt leaves cath lab in stable condition. Rt groin unremarkable. Dressing is CDI.

## 2016-02-16 NOTE — Progress Notes (Signed)
  Echocardiogram 2D Echocardiogram has been performed.  Diamond Nickel 02/16/2016, 8:53 AM

## 2016-02-16 NOTE — Progress Notes (Signed)
SUBJECTIVE: The patient is doing well today.  At this time, he denies chest pain, shortness of breath, or any new concerns.  CURRENT MEDICATIONS: . aspirin EC  81 mg Oral Daily  . atorvastatin  40 mg Oral Daily  . cefTRIAXone (ROCEPHIN)  IV  1 g Intravenous Q24H  . enoxaparin (LOVENOX) injection  40 mg Subcutaneous Q24H  . [START ON 02/17/2016] Influenza vac split quadrivalent PF  0.5 mL Intramuscular Tomorrow-1000  . metoprolol tartrate  25 mg Oral BID  . sodium chloride flush  3 mL Intravenous Q12H      OBJECTIVE: Physical Exam: Vitals:   02/14/16 2211 02/15/16 1300 02/16/16 0024 02/16/16 0557  BP: 137/81 136/68 (!) 100/52 124/60  Pulse: 66 80 68 60  Resp: 18 18 20 18   Temp: 98.9 F (37.2 C) 98.3 F (36.8 C) 98.4 F (36.9 C) 97.9 F (36.6 C)  TempSrc: Oral Oral Oral Oral  SpO2: 97% 95% 95% 97%  Weight: 208 lb 11.2 oz (94.7 kg)   211 lb 1.6 oz (95.8 kg)  Height: 5\' 9"  (1.753 m)       Intake/Output Summary (Last 24 hours) at 02/16/16 0737 Last data filed at 02/16/16 0558  Gross per 24 hour  Intake              600 ml  Output             1250 ml  Net             -650 ml    Telemetry reveals sinus rhythm with sustained SVT last night at 170bpm  GEN- The patient is well appearing, alert and oriented x 3 today.   Head- normocephalic, atraumatic Eyes-  Sclera clear, conjunctiva pink Ears- hearing intact Oropharynx- clear Neck- supple  Lungs- Clear to ausculation bilaterally, normal work of breathing Heart- Regular rate and rhythm, no murmurs, rubs or gallops  GI- soft, NT, ND, + BS Extremities- no clubbing, cyanosis, or edema Skin- no rash or lesion Psych- euthymic mood, full affect Neuro- strength and sensation are intact  LABS: Basic Metabolic Panel:  Recent Labs  02/14/16 1428 02/15/16 0401  NA 138 137  K 3.3* 3.7  CL 103 103  CO2 25 26  GLUCOSE 110* 126*  BUN 16 11  CREATININE 0.95 0.87  CALCIUM 9.7 9.2  MG  --  1.9  PHOS  --  3.1   Liver  Function Tests:  Recent Labs  02/15/16 0401  AST 32  ALT 30  ALKPHOS 77  BILITOT 1.0  PROT 6.2*  ALBUMIN 3.8   CBC:  Recent Labs  02/14/16 1428 02/15/16 0401  WBC 8.1 8.7  HGB 13.9 12.9*  HCT 40.3 39.0  MCV 93.3 94.7  PLT 205 200   Cardiac Enzymes:  Recent Labs  02/14/16 2247 02/15/16 0401 02/15/16 1010  TROPONINI <0.03 <0.03 <0.03   Hemoglobin A1C:  Recent Labs  02/15/16 0401  HGBA1C 5.7*  Thyroid Function Tests:  Recent Labs  02/15/16 0401  TSH 1.744    RADIOLOGY: Dg Chest 2 View Result Date: 02/14/2016 CLINICAL DATA:  Syncope. EXAM: CHEST  2 VIEW COMPARISON:  None FINDINGS: The heart size and mediastinal contours are within normal limits. Both lungs are clear. The visualized skeletal structures are unremarkable. IMPRESSION: No active cardiopulmonary disease. Electronically Signed   By: Kerby Moors M.D.   On: 02/14/2016 19:39   Ct Head Wo Contrast Result Date: 02/14/2016 CLINICAL DATA:  Patient fell today with loss of  consciousness. Fell on concrete bowel hitting vertex of the head. Headache. EXAM: CT HEAD WITHOUT CONTRAST TECHNIQUE: Contiguous axial images were obtained from the base of the skull through the vertex without intravenous contrast. COMPARISON:  None. FINDINGS: BRAIN: The ventricles and sulci are normal for age. Minimal small vessel ischemic disease of periventricular white matter and centrum semiovale, more so on the right. No intraparenchymal hemorrhage, mass effect nor midline shift. No acute large vascular territory infarcts. No abnormal extra-axial fluid collections. Basal cisterns are patent. VASCULAR: Moderate calcific atherosclerosis of the carotid siphons. SKULL: No skull fracture. Slight scalp soft tissue swelling near the vertex of the skull posteriorly. SINUSES/ORBITS: The mastoid air-cells and included paranasal sinuses are well-aerated.The included ocular globes and orbital contents are non-suspicious. OTHER: None. IMPRESSION: Mild  soft tissue swelling of the scalp near the vertex of the skull. No acute osseous abnormality. No acute intracranial abnormality. Electronically Signed   By: Ashley Royalty M.D.   On: 02/14/2016 17:37    ASSESSMENT AND PLAN:  Active Problems:   Hypertension   SVT (supraventricular tachycardia) (HCC)   Syncope and collapse   UTI (urinary tract infection)   Hypokalemia   Acute cystitis without hematuria   Benign prostatic hyperplasia with urinary frequency  1.  Short RP tachycardia Documented last night and terminated with IV Lopressor. He has had recurrent SVT despite BB therapy for HTN.  Discussed treatment options with patient. 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  Risks, benefits to ablation reviewed with the patient who wishes to proceed. Will try to do while he is inpatient as the schedule allows.   2.  HTN Stable No change required today  Chanetta Marshall, NP 02/16/2016 7:41 AM  EP Attending  Patient seen and examined. Agree with above. He has developed symptomatic SVT which correlates with prior episodes of svt with syncope. I have discussed the risks/benefits/goals/expectations of catheter ablation of SVT with the patient and his wife and they wish to proceed.  Mikle Bosworth.D.

## 2016-02-16 NOTE — Discharge Instructions (Signed)
No driving for 3 days. No lifting over 5 lbs for 1 week. No sexual activity for 1 week. You may return to work in 1 week.  Keep procedure site clean & dry. If you notice increased pain, swelling, bleeding or pus, call/return!  You may shower, but no soaking baths/hot tubs/pools for 1 week.  ° ° °

## 2016-02-16 NOTE — Progress Notes (Signed)
Pt's HR is sustained around 150's between the timing of 7pm to 10pm, called MD, IV metoprolol 5mg  provided besides regular schedule, after that it dropped to 70's-80's most of the part of the night.

## 2016-02-17 DIAGNOSIS — E876 Hypokalemia: Secondary | ICD-10-CM | POA: Diagnosis not present

## 2016-02-17 DIAGNOSIS — N401 Enlarged prostate with lower urinary tract symptoms: Secondary | ICD-10-CM | POA: Diagnosis not present

## 2016-02-17 DIAGNOSIS — I471 Supraventricular tachycardia: Secondary | ICD-10-CM | POA: Diagnosis not present

## 2016-02-17 DIAGNOSIS — R55 Syncope and collapse: Secondary | ICD-10-CM | POA: Diagnosis not present

## 2016-02-17 DIAGNOSIS — N3000 Acute cystitis without hematuria: Secondary | ICD-10-CM | POA: Diagnosis not present

## 2016-02-17 LAB — BASIC METABOLIC PANEL
ANION GAP: 9 (ref 5–15)
BUN: 13 mg/dL (ref 6–20)
CO2: 26 mmol/L (ref 22–32)
Calcium: 9.2 mg/dL (ref 8.9–10.3)
Chloride: 103 mmol/L (ref 101–111)
Creatinine, Ser: 0.89 mg/dL (ref 0.61–1.24)
GFR calc Af Amer: >60 mL/min (ref >60)
GFR calc non Af Amer: >60 mL/min (ref >60)
GLUCOSE: 108 mg/dL — AB (ref 65–99)
POTASSIUM: 3.9 mmol/L (ref 3.5–5.1)
Sodium: 138 mmol/L (ref 135–145)

## 2016-02-17 LAB — URINE CULTURE

## 2016-02-17 LAB — MAGNESIUM: Magnesium: 2 mg/dL (ref 1.7–2.4)

## 2016-02-17 MED ORDER — CEFUROXIME AXETIL 500 MG PO TABS
500.0000 mg | ORAL_TABLET | Freq: Two times a day (BID) | ORAL | 0 refills | Status: DC
Start: 2016-02-17 — End: 2018-12-03

## 2016-02-17 MED ORDER — METOPROLOL TARTRATE 25 MG PO TABS: 25.0000 mg | ORAL_TABLET | Freq: Two times a day (BID) | ORAL | 1 refills | Status: DC

## 2016-02-17 NOTE — Progress Notes (Signed)
Patient Name: Edwin Levine Date of Encounter: 02/17/2016     Active Problems:   Hypertension   SVT (supraventricular tachycardia) (HCC)   Syncope and collapse   UTI (urinary tract infection)   Hypokalemia   Acute cystitis without hematuria   Benign prostatic hyperplasia with urinary frequency    SUBJECTIVE  No chest pain or sob.   CURRENT MEDS . aspirin EC  81 mg Oral Daily  . atorvastatin  40 mg Oral Daily  . cefTRIAXone (ROCEPHIN)  IV  1 g Intravenous Q24H  . enoxaparin (LOVENOX) injection  40 mg Subcutaneous Q24H  . Influenza vac split quadrivalent PF  0.5 mL Intramuscular Tomorrow-1000  . metoprolol tartrate  25 mg Oral BID  . sodium chloride flush  3 mL Intravenous Q12H  . sodium chloride flush  3 mL Intravenous Q12H  . tamsulosin  0.8 mg Oral QPC supper    OBJECTIVE  Vitals:   02/16/16 1600 02/16/16 1720 02/16/16 2016 02/17/16 0415  BP: 123/70 106/72 (!) 130/57 116/60  Pulse: (!) 55 61 64 62  Resp:   18 18  Temp:   98.2 F (36.8 C) 98.5 F (36.9 C)  TempSrc:   Oral Oral  SpO2:   94% 95%  Weight:    211 lb 8 oz (95.9 kg)  Height:        Intake/Output Summary (Last 24 hours) at 02/17/16 0852 Last data filed at 02/17/16 0833  Gross per 24 hour  Intake              483 ml  Output             1500 ml  Net            -1017 ml   Filed Weights   02/14/16 2211 02/16/16 0557 02/17/16 0415  Weight: 208 lb 11.2 oz (94.7 kg) 211 lb 1.6 oz (95.8 kg) 211 lb 8 oz (95.9 kg)    PHYSICAL EXAM  General: Pleasant, NAD. Neuro: Alert and oriented X 3. Moves all extremities spontaneously. Psych: Normal affect. HEENT:  Normal  Neck: Supple without bruits or JVD. Lungs:  Resp regular and unlabored, CTA. Heart: RRR no s3, s4, or murmurs. Abdomen: Soft, non-tender, non-distended, BS + x 4.  Extremities: No clubbing, cyanosis or edema. DP/PT/Radials 2+ and equal bilaterally. Right groin with no hematoma  Accessory Clinical Findings  CBC  Recent Labs  02/14/16 1428 02/15/16 0401  WBC 8.1 8.7  HGB 13.9 12.9*  HCT 40.3 39.0  MCV 93.3 94.7  PLT 205 [id number]   Basic Metabolic Panel  Recent Labs  02/15/16 0401 02/17/16 0429  NA 137 138  K 3.7 3.9  CL 103 103  CO2 26 26  GLUCOSE 126* 108*  BUN 11 13  CREATININE 0.87 0.89  CALCIUM 9.2 9.2  MG 1.9 2.0  PHOS 3.1  --    Liver Function Tests  Recent Labs  02/15/16 0401  AST 32  ALT 30  ALKPHOS 77  BILITOT 1.0  PROT 6.2*  ALBUMIN 3.8   No results for input(s): LIPASE, AMYLASE in the last 72 hours. Cardiac Enzymes  Recent Labs  02/14/16 2247 02/15/16 0401 02/15/16 1010  TROPONINI <0.03 <0.03 <0.03   BNP Invalid input(s): POCBNP D-Dimer No results for input(s): DDIMER in the last 72 hours. Hemoglobin A1C  Recent Labs  02/15/16 0401  HGBA1C 5.7*   Fasting Lipid Panel No results for input(s): CHOL, HDL, LDLCALC, TRIG, CHOLHDL, LDLDIRECT in the last 72  hours. Thyroid Function Tests  Recent Labs  02/15/16 0401  TSH 1.744    TELE  Nsr/sinus brady  Radiology/Studies  Dg Chest 2 View  Result Date: 02/14/2016 CLINICAL DATA:  Syncope. EXAM: CHEST  2 VIEW COMPARISON:  None FINDINGS: The heart size and mediastinal contours are within normal limits. Both lungs are clear. The visualized skeletal structures are unremarkable. IMPRESSION: No active cardiopulmonary disease. Electronically Signed   By: Kerby Moors M.D.   On: 02/14/2016 19:39   Ct Head Wo Contrast  Result Date: 02/14/2016 CLINICAL DATA:  Patient fell today with loss of consciousness. Fell on concrete bowel hitting vertex of the head. Headache. EXAM: CT HEAD WITHOUT CONTRAST TECHNIQUE: Contiguous axial images were obtained from the base of the skull through the vertex without intravenous contrast. COMPARISON:  None. FINDINGS: BRAIN: The ventricles and sulci are normal for age. Minimal small vessel ischemic disease of periventricular white matter and centrum semiovale, more so on the right. No  intraparenchymal hemorrhage, mass effect nor midline shift. No acute large vascular territory infarcts. No abnormal extra-axial fluid collections. Basal cisterns are patent. VASCULAR: Moderate calcific atherosclerosis of the carotid siphons. SKULL: No skull fracture. Slight scalp soft tissue swelling near the vertex of the skull posteriorly. SINUSES/ORBITS: The mastoid air-cells and included paranasal sinuses are well-aerated.The included ocular globes and orbital contents are non-suspicious. OTHER: None. IMPRESSION: Mild soft tissue swelling of the scalp near the vertex of the skull. No acute osseous abnormality. No acute intracranial abnormality. Electronically Signed   By: Ashley Royalty M.D.   On: 02/14/2016 17:37    ASSESSMENT AND PLAN  1. 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 Would continue home meds including beta blocker 2. Syncope - due to his SVT and should be resolved 3. Driving - he is stable to return to work in one week. No limitations from my perspective.  Carleene Overlie Man Bonneau,M.D.  02/17/2016 8:52 AMPatient ID: Edwin Levine, male   DOB: 1950/09/03, 65 y.o.   MRN: [id number]

## 2016-02-17 NOTE — Discharge Summary (Signed)
Physician Discharge Summary  Edwin Levine [id number] DOB: 1950-05-15 DOA: 02/14/2016  PCP: Pcp Not In System  Admit date: 02/14/2016 Discharge date: 02/17/2016  Admitted From: Home Disposition:  Home  Recommendations for Outpatient Follow-up:  1. Follow up with PCP in 1-2 weeks 2. Please obtain BMP/CBC in one week   Home Health:No Equipment/Devices: N/A  Discharge Condition:Stable CODE STATUS: FULL Diet recommendation: Heart Healthy   Brief/Interim Summary: 65 y.o.malewith medical history ofhypertension, HLD and ? arrhythmia ?SVT per patient he has been using Valsalva maneuvers when he is feeling lightheaded which most of the time resolve his symptoms. This episode he tried to use valsalva but did not work and fainted. Patient has had 3 episodes in the past couple of weeks. Patient LOC for few seconds the everything was back to normal, no chest pain or palpitations. Had cardiac work up 5 yrs ago, per family was negative. Patient had an increase in his Flomax about 3 month ago. Patient also report high BP readings at home >160's. Patient admitted for syncope eval.  The patient was noted to have SVT on telemetry despite being on metoprolol.  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  Given his occupation, the patient underwent ablation on 02/16/16 with no postop complications.  Discharge Diagnoses:  Syncope-  -secondary to SVT -CT head negative -Orthostatics--neg -02/16/16 ECHO--EF 55-60%, no WMA, trivial AI, MR, TR -Continue Tele--had recurrent SVT despite BB therapy for HTN -Cardio consult appreciated-->catheter ablation on 10/25  SVT--AVNRT and Aflutter -s/p catheter ablation on 02/16/16 -remained in sinus thereafter  HTN - stable  -continue metoprolol tartrate 25 mg bid after d/c -BP remained well controlled on metoprolol -stop lisinopril/HCTZ -Follow up with PCP for further titration or additions  UTI -  Klebsiella -received ceftriaxone during the hospitalization -Home with cefuroxime x 4 more days to complete one week of therapy  BPH  -restart flomax as he had urine retention which is contributing to UTI  Impaired glucose tolerance -continue lifestyle modification -02/15/2016 hemoglobin A1c 5.7    Discharge Instructions  Discharge Instructions    Diet - low sodium heart healthy    Complete by:  As directed    Increase activity slowly    Complete by:  As directed        Medication List    STOP taking these medications   ciprofloxacin 500 MG tablet Commonly known as:  CIPRO   GOODY HEADACHE PO   lisinopril-hydrochlorothiazide 20-25 MG tablet Commonly known as:  PRINZIDE,ZESTORETIC   naproxen sodium 220 MG tablet Commonly known as:  ANAPROX     TAKE these medications   acetaminophen 325 MG tablet Commonly known as:  TYLENOL Take 650 mg by mouth every 6 (six) hours as needed for mild pain.   aspirin 81 MG tablet Take 81 mg by mouth daily.   atorvastatin 40 MG tablet Commonly known as:  LIPITOR Take 40 mg by mouth daily.   cefUROXime 500 MG tablet Commonly known as:  CEFTIN Take 1 tablet (500 mg total) by mouth 2 (two) times daily with a meal.   Fish Oil 1000 MG Caps Take 1 capsule by mouth daily.   metoprolol tartrate 25 MG tablet Commonly known as:  LOPRESSOR Take 1 tablet (25 mg total) by mouth 2 (two) times daily.   MOOD PLUS STRESS RELIEF PO Take 1 tablet by mouth daily.   tamsulosin 0.4 MG Caps capsule Commonly known as:  FLOMAX Take 0.8 mg by mouth daily after supper.  VITAMIN B-12 PO Take 1 tablet by mouth daily.      Follow-up Information    Cristopher Peru, MD Follow up on 03/21/2016.   Specialty:  Cardiology Why:  at 12:15PM  Contact information: [address] [place] [address] [phone]          No Known Allergies  Consultations: Cardiology/Electrophysiology  Procedures/Studies: Dg Chest 2  View  Result Date: 02/14/2016 CLINICAL DATA:  Syncope. EXAM: CHEST  2 VIEW COMPARISON:  None FINDINGS: The heart size and mediastinal contours are within normal limits. Both lungs are clear. The visualized skeletal structures are unremarkable. IMPRESSION: No active cardiopulmonary disease. Electronically Signed   By: Kerby Moors M.D.   On: 02/14/2016 19:39   Ct Head Wo Contrast  Result Date: 02/14/2016 CLINICAL DATA:  Patient fell today with loss of consciousness. Fell on concrete bowel hitting vertex of the head. Headache. EXAM: CT HEAD WITHOUT CONTRAST TECHNIQUE: Contiguous axial images were obtained from the base of the skull through the vertex without intravenous contrast. COMPARISON:  None. FINDINGS: BRAIN: The ventricles and sulci are normal for age. Minimal small vessel ischemic disease of periventricular white matter and centrum semiovale, more so on the right. No intraparenchymal hemorrhage, mass effect nor midline shift. No acute large vascular territory infarcts. No abnormal extra-axial fluid collections. Basal cisterns are patent. VASCULAR: Moderate calcific atherosclerosis of the carotid siphons. SKULL: No skull fracture. Slight scalp soft tissue swelling near the vertex of the skull posteriorly. SINUSES/ORBITS: The mastoid air-cells and included paranasal sinuses are well-aerated.The included ocular globes and orbital contents are non-suspicious. OTHER: None. IMPRESSION: Mild soft tissue swelling of the scalp near the vertex of the skull. No acute osseous abnormality. No acute intracranial abnormality. Electronically Signed   By: Ashley Royalty M.D.   On: 02/14/2016 17:37        Discharge Exam: Vitals:   02/17/16 0905 02/17/16 1200  BP: 116/69 130/74  Pulse: 70 63  Resp: 18 18  Temp: 97.3 F (36.3 C) 97.6 F (36.4 C)   Vitals:   02/16/16 2016 02/17/16 0415 02/17/16 0905 02/17/16 1200  BP: (!) 130/57 116/60 116/69 130/74  Pulse: 64 62 70 63  Resp: 18 18 18 18   Temp: 98.2 F  (36.8 C) 98.5 F (36.9 C) 97.3 F (36.3 C) 97.6 F (36.4 C)  TempSrc: Oral Oral Oral Oral  SpO2: 94% 95% 96%   Weight:  95.9 kg (211 lb 8 oz)    Height:        General: Pt is alert, awake, not in acute distress Cardiovascular: RRR, S1/S2 +, no rubs, no gallops Respiratory: CTA bilaterally, no wheezing, no rhonchi Abdominal: Soft, NT, ND, bowel sounds + Extremities: no edema, no cyanosis   The results of significant diagnostics from this hospitalization (including imaging, microbiology, ancillary and laboratory) are listed below for reference.    Significant Diagnostic Studies: Dg Chest 2 View  Result Date: 02/14/2016 CLINICAL DATA:  Syncope. EXAM: CHEST  2 VIEW COMPARISON:  None FINDINGS: The heart size and mediastinal contours are within normal limits. Both lungs are clear. The visualized skeletal structures are unremarkable. IMPRESSION: No active cardiopulmonary disease. Electronically Signed   By: Kerby Moors M.D.   On: 02/14/2016 19:39   Ct Head Wo Contrast  Result Date: 02/14/2016 CLINICAL DATA:  Patient fell today with loss of consciousness. Fell on concrete bowel hitting vertex of the head. Headache. EXAM: CT HEAD WITHOUT CONTRAST TECHNIQUE: Contiguous axial images were obtained from the base  of the skull through the vertex without intravenous contrast. COMPARISON:  None. FINDINGS: BRAIN: The ventricles and sulci are normal for age. Minimal small vessel ischemic disease of periventricular white matter and centrum semiovale, more so on the right. No intraparenchymal hemorrhage, mass effect nor midline shift. No acute large vascular territory infarcts. No abnormal extra-axial fluid collections. Basal cisterns are patent. VASCULAR: Moderate calcific atherosclerosis of the carotid siphons. SKULL: No skull fracture. Slight scalp soft tissue swelling near the vertex of the skull posteriorly. SINUSES/ORBITS: The mastoid air-cells and included paranasal sinuses are well-aerated.The  included ocular globes and orbital contents are non-suspicious. OTHER: None. IMPRESSION: Mild soft tissue swelling of the scalp near the vertex of the skull. No acute osseous abnormality. No acute intracranial abnormality. Electronically Signed   By: Ashley Royalty M.D.   On: 02/14/2016 17:37     Microbiology: Recent Results (from the past 240 hour(s))  Urine culture     Status: Abnormal   Collection Time: 02/14/16  5:37 PM  Result Value Ref Range Status   Specimen Description URINE, RANDOM  Final   Special Requests NONE  Final   Culture >=100,000 COLONIES/mL KLEBSIELLA PNEUMONIAE (A)  Final   Report Status 02/17/2016 FINAL  Final   Organism ID, Bacteria KLEBSIELLA PNEUMONIAE (A)  Final      Susceptibility   Klebsiella pneumoniae - MIC*    AMPICILLIN >=32 RESISTANT Resistant     CEFAZOLIN <=4 SENSITIVE Sensitive     CEFTRIAXONE <=1 SENSITIVE Sensitive     CIPROFLOXACIN <=0.25 SENSITIVE Sensitive     GENTAMICIN >=16 RESISTANT Resistant     IMIPENEM 1 SENSITIVE Sensitive     NITROFURANTOIN 32 SENSITIVE Sensitive     TRIMETH/SULFA <=20 SENSITIVE Sensitive     AMPICILLIN/SULBACTAM 8 SENSITIVE Sensitive     PIP/TAZO 8 SENSITIVE Sensitive     Extended ESBL NEGATIVE Sensitive     * >=100,000 COLONIES/mL KLEBSIELLA PNEUMONIAE     Labs: Basic Metabolic Panel:  Recent Labs Lab 02/14/16 1428 02/15/16 0401 02/17/16 0429  NA 138 137 138  K 3.3* 3.7 3.9  CL 103 103 103  CO2 25 26 26   GLUCOSE 110* 126* 108*  BUN 16 11 13   CREATININE 0.95 0.87 0.89  CALCIUM 9.7 9.2 9.2  MG  --  1.9 2.0  PHOS  --  3.1  --    Liver Function Tests:  Recent Labs Lab 02/15/16 0401  AST 32  ALT 30  ALKPHOS 77  BILITOT 1.0  PROT 6.2*  ALBUMIN 3.8   No results for input(s): LIPASE, AMYLASE in the last 168 hours. No results for input(s): AMMONIA in the last 168 hours. CBC:  Recent Labs Lab 02/14/16 1428 02/15/16 0401  WBC 8.1 8.7  HGB 13.9 12.9*  HCT 40.3 39.0  MCV 93.3 94.7  PLT 205 200     Cardiac Enzymes:  Recent Labs Lab 02/14/16 2247 02/15/16 0401 02/15/16 1010  TROPONINI <0.03 <0.03 <0.03   BNP: Invalid input(s): POCBNP CBG: No results for input(s): GLUCAP in the last 168 hours.  Time coordinating discharge:  Greater than 30 minutes  Signed:  Shantay Sonn, DO [facility] Pager: [phone] 02/17/2016, 12:46 PM

## 2016-02-17 NOTE — Progress Notes (Signed)
Patient given discharge instructions and all questions answered.  Pt. Discharged via wheelchair with all belongings.   

## 2016-03-10 ENCOUNTER — Encounter: Payer: Self-pay | Admitting: Internal Medicine

## 2016-03-21 ENCOUNTER — Ambulatory Visit (INDEPENDENT_AMBULATORY_CARE_PROVIDER_SITE_OTHER): Payer: 59 | Admitting: Internal Medicine

## 2016-03-21 ENCOUNTER — Encounter (INDEPENDENT_AMBULATORY_CARE_PROVIDER_SITE_OTHER): Payer: Self-pay

## 2016-03-21 ENCOUNTER — Encounter: Payer: Self-pay | Admitting: Internal Medicine

## 2016-03-21 VITALS — BP 124/70 | HR 86 | Ht 69.0 in | Wt 214.0 lb

## 2016-03-21 DIAGNOSIS — I471 Supraventricular tachycardia: Secondary | ICD-10-CM | POA: Diagnosis not present

## 2016-03-21 NOTE — Patient Instructions (Addendum)
Medication Instructions:  Your physician recommends that you continue on your current medications as directed. Please refer to the Current Medication list given to you today.   Labwork: None Ordered   Testing/Procedures: None Ordered   Follow-Up: Follow-up with Dr. Lovena Le as needed.   Any Other Special Instructions Will Be Listed Below --- May return to work without limitations.

## 2016-03-21 NOTE — Progress Notes (Signed)
HPI Mr. Deary returns today for evaluation after undergoing catheter ablation of both atrial flutter and SVT. The patient has had no recurrent symptoms and no additional syncope. It was thought that his syncope due to very rapid 1:1 atrial flutter. He denies chest pain or edema. No palpitations. No Known Allergies   Current Outpatient Prescriptions  Medication Sig Dispense Refill  . acetaminophen (TYLENOL) 325 MG tablet Take 650 mg by mouth every 6 (six) hours as needed for mild pain.    . Ascorbic Acid (VITAMIN C) 1000 MG tablet Take 1,000 mg by mouth daily.    Marland Kitchen aspirin 81 MG tablet Take 81 mg by mouth daily.    Marland Kitchen atorvastatin (LIPITOR) 40 MG tablet Take 40 mg by mouth daily.    . cefUROXime (CEFTIN) 500 MG tablet Take 1 tablet (500 mg total) by mouth 2 (two) times daily with a meal. 9 tablet 0  . CINNAMON PO Take 2,000 mg by mouth daily.    . Cyanocobalamin (VITAMIN B-12 PO) Take 1 tablet by mouth daily.    . metoprolol tartrate (LOPRESSOR) 25 MG tablet Take 1 tablet (25 mg total) by mouth 2 (two) times daily. 60 tablet 1  . Multiple Vitamins-Minerals (STRESS TAB NF PO) Take 1 tablet by mouth daily.    . Omega-3 Fatty Acids (FISH OIL) 1000 MG CAPS Take 1 capsule by mouth daily.     . S-Adenosylmethionine-B6-B12-FA (MOOD PLUS STRESS RELIEF PO) Take 1 tablet by mouth daily.    . tamsulosin (FLOMAX) 0.4 MG CAPS capsule Take 0.8 mg by mouth daily after supper.     No current facility-administered medications for this visit.      Past Medical History:  Diagnosis Date  . GERD (gastroesophageal reflux disease)   . Hyperlipidemia   . Hypertension   . Palpitations     ROS:   All systems reviewed and negative except as noted in the HPI.   Past Surgical History:  Procedure Laterality Date  . ELECTROPHYSIOLOGIC STUDY N/A 02/16/2016   Procedure: SVT Ablation;  Surgeon: Evans Lance, MD;  Location: [facility];  Service: Cardiovascular;  Laterality: N/A;  . HERNIA  REPAIR       Family History  Problem Relation Age of Onset  . Heart Problems Mother   . Aneurysm Father   . Diabetes Neg Hx   . Cancer Neg Hx   . Stroke Neg Hx      Social History   Social History  . Marital status: Married    Spouse name: N/A  . Number of children: N/A  . Years of education: N/A   Occupational History  . 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 Smoking status: Never Smoker  . Smokeless tobacco: Never Used  . Alcohol use No  . Drug use: No  . Sexual activity: Not on file   Other Topics Concern  . Not on file   Social History Narrative   Lives with his wife in [place], [place].     BP 124/70   Pulse 86   Ht 5\' 9"  (1.753 m)   Wt 214 lb (97.1 kg)   BMI 31.60 kg/m   Physical Exam:  Well appearing 65 yo woman, NAD HEENT: Unremarkable Neck:  6 cm JVD, no thyromegally Lymphatics:  No adenopathy Back:  No CVA tenderness Lungs:  Clear with no wheezes HEART:  Regular rate rhythm, no murmurs, no rubs, no clicks Abd:  soft, positive bowel sounds, no organomegally, no rebound, no guarding Ext:  2 plus pulses, no edema, no cyanosis, no clubbing Skin:  No rashes no nodules Neuro:  CN II through XII intact, motor grossly intact  EKG - nsr   Assess/Plan: 1. SVT - he is s/p ablation of AVNRT and is doing well. No change in meds. 2. Atrial flutter - he has had no recurrent atrial flutter. Will follow. 3. Syncope - no additional symptoms since his ablation. Will follow. 4. HTN - his blood pressure is well controlled. We discussed stopping the metoprolol. From my perspective he can continue. We discussed switching meds as well. I asked him to keep a bp log and display for his primary MD.   Mikle Bosworth.D.

## 2018-12-03 ENCOUNTER — Other Ambulatory Visit: Payer: Self-pay

## 2018-12-03 ENCOUNTER — Encounter: Payer: Self-pay | Admitting: Student

## 2018-12-03 ENCOUNTER — Ambulatory Visit (INDEPENDENT_AMBULATORY_CARE_PROVIDER_SITE_OTHER): Payer: 59 | Admitting: Student

## 2018-12-03 ENCOUNTER — Encounter (INDEPENDENT_AMBULATORY_CARE_PROVIDER_SITE_OTHER): Payer: Self-pay

## 2018-12-03 VITALS — BP 142/72 | HR 77 | Ht 69.0 in | Wt 220.0 lb

## 2018-12-03 DIAGNOSIS — I471 Supraventricular tachycardia, unspecified: Secondary | ICD-10-CM

## 2018-12-03 MED ORDER — METOPROLOL TARTRATE 25 MG PO TABS: 12.5000 mg | ORAL_TABLET | Freq: Two times a day (BID) | ORAL | 3 refills | Status: DC

## 2018-12-03 NOTE — Progress Notes (Addendum)
PCP:  Edwin Contras, MD Primary Cardiologist: No primary care provider on file. Electrophysiologist: Edwin. Adron Bene [name] is a 68 y.o. male who presents today for routine electrophysiology followup. They are seen for Edwin Levine. Pt has a history of HTN, SVT and AFL s/p ablation for both.  He has a history of syncope that was thought to be due to rapid A-flutter.   Since last being seen in our clinic, the patient reports doing very well.    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   He reports occasional palpitations. 1-2 times a month. They usually go away after less than 5 minutes and respond to vagal maneuvers. He denies exertional chest pain, lightheadedness, dizziness, syncope, near syncope, or peripheral edema. He has come off Lopressor since his last visit, but isn't sure why. He drives with a CDL, and has been cleared to continue pending a letter from [place].   The patient feels that he is tolerating medications without difficulties and is otherwise without complaint today.   Past Medical History:  Diagnosis Date  . GERD (gastroesophageal reflux disease)   . Hyperlipidemia   . Hypertension   . Palpitations    Past Surgical History:  Procedure Laterality Date  . ELECTROPHYSIOLOGIC STUDY N/A 02/16/2016   Procedure: SVT Ablation;  Surgeon: Edwin Lance, MD;  Location: [facility];  Service: Cardiovascular;  Laterality: N/A;  . HERNIA REPAIR      Current Outpatient Medications  Medication Sig Dispense Refill  . acetaminophen (TYLENOL) 325 MG tablet Take 650 mg by mouth every 6 (six) hours as needed for mild pain.    . Ascorbic Acid (VITAMIN C) 1000 MG tablet Take 1,000 mg by mouth daily.    Marland Kitchen aspirin 81 MG tablet Take 81 mg by mouth daily.    Marland Kitchen atorvastatin (LIPITOR) 40 MG tablet Take 40 mg by mouth daily.    . Calcium Carb-Cholecalciferol (CALCIUM 600 + D PO) Take by mouth daily.     Marland Kitchen CINNAMON PO Take 2,000 mg by mouth daily.    . Cyanocobalamin (VITAMIN B-12 PO) Take 1 tablet by mouth daily.    Marland Kitchen lisinopril-hydrochlorothiazide (ZESTORETIC) 20-25 MG tablet Take 1 tablet by mouth daily.     . metoprolol tartrate (LOPRESSOR) 25 MG tablet Take 0.5 tablets (12.5 mg total) by mouth 2 (two) times daily. 90 tablet 3  . Multiple Vitamins-Minerals (STRESS TAB NF PO) Take 1 tablet by mouth daily.    . naproxen sodium (ALEVE) 220 MG tablet Take 220 mg by mouth.    . Omega-3 Fatty Acids (FISH OIL) 1000 MG CAPS Take 1 capsule by mouth daily.     . S-Adenosylmethionine-B6-B12-FA (MOOD PLUS STRESS RELIEF PO) Take 1 tablet by mouth daily.    Marland Kitchen sulfamethoxazole-trimethoprim (BACTRIM) 400-80 MG tablet Take 1 tablet by mouth at bedtime.    . tamsulosin (FLOMAX) 0.4 MG CAPS capsule Take 0.8 mg by mouth daily after supper.     No current facility-administered medications for this visit.     No Known Allergies  Social History   Socioeconomic History  . Marital status: Married    Spouse name: Not on file  . Number of children: Not on file  . Years of education: Not on file  . Highest education level: Not on file  Occupational History  . Occupation: Drives [id number]    Employer: [employer]  Social Needs  . Financial resource strain: Not on file  . Food insecurity    Worry: Not on file    Inability: Not on file  . Transportation needs    Medical: Not on file    Non-medical: Not on file  Tobacco Use  . Smoking status: Never Smoker  . Smokeless tobacco: Never Used  Substance and Sexual Activity  . Alcohol use: No  . Drug use: No  . Sexual activity: Not on file  Lifestyle  . Physical activity    Days per week: Not on file    Minutes per session: Not on file  . Stress: Not on file  Relationships  . Social Herbalist on phone: Not on file    Gets together: Not on file    Attends religious service: Not on file    Active member of club or organization:  Not on file    Attends meetings of clubs or organizations: Not on file    Relationship status: Not on file  . Intimate partner violence    Fear of current or ex partner: Not on file    Emotionally abused: Not on file    Physically abused: Not on file    Forced sexual activity: Not on file  Other Topics Concern  . Not on file  Social History Narrative   Lives with his wife in [place], [place].    Review of Systems: General: No chills, fever, night sweats or weight changes  Cardiovascular:  No chest pain, dyspnea on exertion, edema, orthopnea, palpitations, paroxysmal nocturnal dyspnea Dermatological: No rash, lesions or masses Respiratory: No cough, dyspnea Urologic: No hematuria, dysuria Abdominal: No nausea, vomiting, diarrhea, bright red blood per rectum, melena, or hematemesis Neurologic: No visual changes, weakness, changes in mental status All other systems reviewed and are otherwise negative except as noted above.  Physical Exam: Vitals:   12/03/18 0754  BP: (!) 142/72  Pulse: 77  Weight: 220 lb (99.8 kg)  Height: 5\' 9"  (1.753 m)    GEN- The patient is well appearing, alert and oriented x 3 today.   HEENT: normocephalic, atraumatic; sclera clear, conjunctiva pink; hearing intact; oropharynx clear; neck supple, no JVP Lymph- no cervical lymphadenopathy Lungs- Clear to ausculation bilaterally, normal work of breathing.  No wheezes, rales, rhonchi Heart- Regular rate and rhythm, no murmurs, rubs or gallops, PMI not laterally displaced GI- soft, non-tender, non-distended, bowel sounds present, no hepatosplenomegaly Extremities- no clubbing, cyanosis, or edema; DP/PT/radial pulses 2+ bilaterally MS- no significant deformity or atrophy Skin- warm and dry, no rash or lesion Psych- euthymic mood, full affect Neuro- strength and sensation are intact  EKG - Personally reviewed, shows NSR at 77 bpm with a PAC  Assessment and Plan:  1. SVT  s/p ablation 01/2016. He has felt  recurrent palpitations 1-2 times a month for the past several months. Will try back on Lopressor 12.5 mg BID.  2. Atrial Flutter s/p ablation 01/2016 Occasional palpitations. Adjusting medication as above 3. HTN Adding back lopressor as above.   Discussed with DOT provider. 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   Edwin Friar, PA-C  12/03/18 9:12 AM

## 2018-12-03 NOTE — Patient Instructions (Signed)
Medication Instructions:   START TAKING METOPROLOL 12.5 MG TWICE A DAY   If you need a refill on your cardiac medications before your next appointment, please call your pharmacy.   Lab work: NONE ORDERED  TODAY   If you have labs (blood work) drawn today and your tests are completely normal, you will receive your results only by: Marland Kitchen MyChart Message (if you have MyChart) OR . A paper copy in the mail If you have any lab test that is abnormal or we need to change your treatment, we will call you to review the results.  Testing/Procedures: NONE ORDERED  TODAY  Follow-Up: At [facility], you and your health needs are our priority.  As part of our continuing mission to provide you with exceptional heart care, we have created designated Provider Care Teams.  These Care Teams include your primary Cardiologist (physician) and Advanced Practice Providers (APPs -  Physician Assistants and Nurse Practitioners) who all work together to provide you with the care you need, when you need it. You will need a follow up appointment in 1 years.  Please call our office 2 months in advance to schedule this appointment.  You may see Dr. Lovena Le or one of the following Advanced Practice Providers on your designated Care Team:   . Tommye Standard, PA-C . Joesph July PA-C  Any Other Special Instructions Will Be Listed Below (If Applicable).

## 2019-10-28 ENCOUNTER — Telehealth: Payer: Self-pay | Admitting: Student

## 2019-10-28 NOTE — Telephone Encounter (Signed)
New message   Patient needs a letter for a DOT Physical by 11/26/19. Please call to discuss.

## 2019-10-29 NOTE — Telephone Encounter (Signed)
  It depends on what he needs! If he needs a DOT certification I am not able to do that.   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

## 2019-10-29 NOTE — Telephone Encounter (Signed)
I reached out to patient to get clarification on his request for the letter he is requesting. 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 He was seen and cleared with a similar letter August 2020 by Oda Kilts, PA-C. I informed the patient that I would ask Oda Kilts, PA-C or Dr. Lovena Le how to proceed, if the patient needs an OV to be evaluated or if the letter can simply be written for the patient to come pick up. The patient informed me his DOT physical isn't due until August but he is wanting to make sure that he has everything in order before he has to go. Pt is aware and agreeable to plan.

## 2019-10-30 NOTE — Telephone Encounter (Signed)
LMTCB to schedule for OV. Detailed message states that first available for Oda Kilts, [place] is Fri 8/6.

## 2019-10-30 NOTE — Telephone Encounter (Signed)
That's fine.   He hasn't been seen in a year though so would like him to have a visit to have this done.  Thank you!

## 2019-11-19 ENCOUNTER — Encounter: Payer: Self-pay | Admitting: Internal Medicine

## 2019-11-19 ENCOUNTER — Other Ambulatory Visit: Payer: Self-pay

## 2019-11-19 ENCOUNTER — Ambulatory Visit (INDEPENDENT_AMBULATORY_CARE_PROVIDER_SITE_OTHER): Payer: 59 | Admitting: Internal Medicine

## 2019-11-19 ENCOUNTER — Encounter: Payer: Self-pay | Admitting: *Deleted

## 2019-11-19 VITALS — BP 122/82 | HR 77 | Ht 69.0 in | Wt 216.0 lb

## 2019-11-19 DIAGNOSIS — I4892 Unspecified atrial flutter: Secondary | ICD-10-CM | POA: Diagnosis not present

## 2019-11-19 DIAGNOSIS — I10 Essential (primary) hypertension: Secondary | ICD-10-CM

## 2019-11-19 DIAGNOSIS — I471 Supraventricular tachycardia: Secondary | ICD-10-CM | POA: Diagnosis not present

## 2019-11-19 NOTE — Progress Notes (Signed)
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 He is a pleasant 69 yo man with a h/o atrial flutter and SVT who underwent EP study and catheter ablation 4 years ago. He has done well in the interim with no chest pain or sob. He has not had any additional SVT and no syncope. He is still working full time but is planning to slow down.  No Known Allergies   Current Outpatient Medications  Medication Sig Dispense Refill  . acetaminophen (TYLENOL) 325 MG tablet Take 650 mg by mouth every 6 (six) hours as needed for mild pain.    . Ascorbic Acid (VITAMIN C) 1000 MG tablet Take 1,000 mg by mouth daily.    Marland Kitchen atorvastatin (LIPITOR) 40 MG tablet Take 40 mg by mouth daily.    . Calcium Carb-Cholecalciferol (CALCIUM 600 + D PO) Take by mouth daily.    Marland Kitchen CINNAMON PO Take 2,000 mg by mouth daily.    . Cyanocobalamin (VITAMIN B-12 PO) Take 1 tablet by mouth daily.    Marland Kitchen lisinopril-hydrochlorothiazide (ZESTORETIC) 20-25 MG tablet Take 1 tablet by mouth daily.     . Multiple Vitamins-Minerals (STRESS TAB NF PO) Take 1 tablet by mouth daily.    . S-Adenosylmethionine-B6-B12-FA (MOOD PLUS STRESS RELIEF PO) Take 1 tablet by mouth daily.    Marland Kitchen sulfamethoxazole-trimethoprim (BACTRIM) 400-80 MG tablet Take 1 tablet by mouth at bedtime.    . tamsulosin (FLOMAX) 0.4 MG CAPS capsule Take 0.8 mg by mouth daily after supper.     No current facility-administered medications for this visit.     Past Medical History:  Diagnosis Date  . GERD (gastroesophageal reflux disease)   . Hyperlipidemia   . Hypertension   . Palpitations     ROS:   All systems reviewed and negative except as noted in the HPI.   Past Surgical History:  Procedure Laterality Date  . ELECTROPHYSIOLOGIC STUDY N/A 02/16/2016   Procedure: SVT Ablation;  Surgeon: Evans Lance, MD;  Location: [facility];  Service: Cardiovascular;  Laterality: N/A;  . HERNIA REPAIR       Family History  Problem  Relation Age of Onset  . Heart Problems Mother   . Aneurysm Father   . Diabetes Neg Hx   . Cancer Neg Hx   . Stroke Neg Hx      Social History   Socioeconomic History  . Marital status: Married    Spouse name: Not on file  . Number of children: Not on file  . Years of education: Not on file  . Highest education level: Not on file  Occupational History  . Occupation: Drives [id number]    Employer: [employer]  Tobacco Use  . Smoking status: Never Smoker  . Smokeless tobacco: Never Used  Substance and Sexual Activity  . Alcohol use: No  . Drug use: No  . Sexual activity: Not on file  Other Topics Concern  . Not on file  Social History Narrative   Lives with his wife in [place], [place].   Social Determinants of Health   Financial Resource Strain:   . Difficulty of Paying Living Expenses:   Food Insecurity:   . Worried About Charity fundraiser in the Last Year:   . Arboriculturist in the Last Year:   Transportation Needs:   . Film/video editor (Medical):   Marland Kitchen Lack of Transportation (Non-Medical):   Physical Activity:   .  Days of Exercise per Week:   . Minutes of Exercise per Session:   Stress:   . Feeling of Stress :   Social Connections:   . Frequency of Communication with Friends and Family:   . Frequency of Social Gatherings with Friends and Family:   . Attends Religious Services:   . Active Member of Clubs or Organizations:   . Attends Archivist Meetings:   Marland Kitchen Marital Status:   Intimate Partner Violence:   . Fear of Current or Ex-Partner:   . Emotionally Abused:   Marland Kitchen Physically Abused:   . Sexually Abused:      BP 122/82   Pulse 77   Ht 5\' 9"  (1.753 m)   Wt (!) 216 lb (98 kg)   SpO2 96%   BMI 31.90 kg/m   Physical Exam:  Well appearing NAD HEENT: Unremarkable Neck:  No JVD, no thyromegally Lymphatics:  No adenopathy Back:  No CVA tenderness Lungs:  Clear with no wheezes HEART:  Regular rate rhythm, no murmurs, no rubs, no  clicks Abd:  soft, positive bowel sounds, no organomegally, no rebound, no guarding Ext:  2 plus pulses, no edema, no cyanosis, no clubbing Skin:  No rashes no nodules Neuro:  CN II through XII intact, motor grossly intact  EKG - nsr with rare PVC  Assess/Plan: 1. SVT - he is s/p ablation and maintaining NSR.  2. HTN - his bp is well controlled.  3. 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   Mikle Bosworth.D.

## 2019-11-19 NOTE — Patient Instructions (Signed)
Medication Instructions:  Your physician recommends that you continue on your current medications as directed. Please refer to the Current Medication list given to you today.  *If you need a refill on your cardiac medications before your next appointment, please call your pharmacy*  Lab Work: None ordered.  If you have labs (blood work) drawn today and your tests are completely normal, you will receive your results only by: Marland Kitchen MyChart Message (if you have MyChart) OR . A paper copy in the mail If you have any lab test that is abnormal or we need to change your treatment, we will call you to review the results.  Testing/Procedures: None ordered.  Follow-Up: At [facility], you and your health needs are our priority.  As part of our continuing mission to provide you with exceptional heart care, we have created designated Provider Care Teams.  These Care Teams include your primary Cardiologist (physician) and Advanced Practice Providers (APPs -  Physician Assistants and Nurse Practitioners) who all work together to provide you with the care you need, when you need it.  We recommend signing up for the patient portal called "MyChart".  Sign up information is provided on this After Visit Summary.  MyChart is used to connect with patients for Virtual Visits (Telemedicine).  Patients are able to view lab/test results, encounter notes, upcoming appointments, etc.  Non-urgent messages can be sent to your provider as well.   To learn more about what you can do with MyChart, go to [email or web address].    Your next appointment:   Your physician wants you to follow-up in: 1 year with Dr. Lovena Le. You will receive a reminder letter in the mail two months in advance. If you don't receive a letter, please call our office to schedule the follow-up appointment.   Other Instructions:

## 2020-01-23 ENCOUNTER — Other Ambulatory Visit (HOSPITAL_COMMUNITY): Payer: Self-pay | Admitting: Gastroenterology

## 2020-01-23 ENCOUNTER — Other Ambulatory Visit (HOSPITAL_COMMUNITY): Payer: Self-pay | Admitting: Family Medicine

## 2020-01-23 DIAGNOSIS — M810 Age-related osteoporosis without current pathological fracture: Secondary | ICD-10-CM

## 2020-02-04 ENCOUNTER — Other Ambulatory Visit (HOSPITAL_COMMUNITY): Payer: 59

## 2020-02-12 ENCOUNTER — Other Ambulatory Visit: Payer: Self-pay

## 2020-02-12 ENCOUNTER — Ambulatory Visit (HOSPITAL_COMMUNITY)
Admission: RE | Admit: 2020-02-12 | Discharge: 2020-02-12 | Disposition: A | Discharge status: Home / Self Care | Payer: 59 | Source: Ambulatory Visit | Attending: Family Medicine | Admitting: Family Medicine

## 2020-02-12 DIAGNOSIS — M810 Age-related osteoporosis without current pathological fracture: Secondary | ICD-10-CM | POA: Insufficient documentation

## 2020-06-14 DIAGNOSIS — R3912 Poor urinary stream: Secondary | ICD-10-CM | POA: Diagnosis not present

## 2020-06-14 DIAGNOSIS — R3914 Feeling of incomplete bladder emptying: Secondary | ICD-10-CM | POA: Diagnosis not present

## 2020-06-14 DIAGNOSIS — R350 Frequency of micturition: Secondary | ICD-10-CM | POA: Diagnosis not present

## 2020-07-05 DIAGNOSIS — R3914 Feeling of incomplete bladder emptying: Secondary | ICD-10-CM | POA: Diagnosis not present

## 2020-07-05 DIAGNOSIS — R339 Retention of urine, unspecified: Secondary | ICD-10-CM | POA: Diagnosis not present

## 2020-07-26 DIAGNOSIS — R3914 Feeling of incomplete bladder emptying: Secondary | ICD-10-CM | POA: Diagnosis not present

## 2020-07-26 DIAGNOSIS — R339 Retention of urine, unspecified: Secondary | ICD-10-CM | POA: Diagnosis not present

## 2020-07-30 DIAGNOSIS — E782 Mixed hyperlipidemia: Secondary | ICD-10-CM | POA: Diagnosis not present

## 2020-07-30 DIAGNOSIS — R7309 Other abnormal glucose: Secondary | ICD-10-CM | POA: Diagnosis not present

## 2020-07-30 DIAGNOSIS — M810 Age-related osteoporosis without current pathological fracture: Secondary | ICD-10-CM | POA: Diagnosis not present

## 2020-07-30 DIAGNOSIS — R7303 Prediabetes: Secondary | ICD-10-CM | POA: Diagnosis not present

## 2020-07-30 DIAGNOSIS — I10 Essential (primary) hypertension: Secondary | ICD-10-CM | POA: Diagnosis not present

## 2020-07-30 DIAGNOSIS — E559 Vitamin D deficiency, unspecified: Secondary | ICD-10-CM | POA: Diagnosis not present

## 2020-07-30 DIAGNOSIS — Z87440 Personal history of urinary (tract) infections: Secondary | ICD-10-CM | POA: Diagnosis not present

## 2020-07-30 DIAGNOSIS — Z8616 Personal history of COVID-19: Secondary | ICD-10-CM | POA: Diagnosis not present

## 2020-09-01 DIAGNOSIS — R3914 Feeling of incomplete bladder emptying: Secondary | ICD-10-CM | POA: Diagnosis not present

## 2020-09-01 DIAGNOSIS — R339 Retention of urine, unspecified: Secondary | ICD-10-CM | POA: Diagnosis not present

## 2020-09-28 DIAGNOSIS — R3914 Feeling of incomplete bladder emptying: Secondary | ICD-10-CM | POA: Diagnosis not present

## 2020-09-28 DIAGNOSIS — R339 Retention of urine, unspecified: Secondary | ICD-10-CM | POA: Diagnosis not present

## 2020-10-28 DIAGNOSIS — R339 Retention of urine, unspecified: Secondary | ICD-10-CM | POA: Diagnosis not present

## 2020-10-28 DIAGNOSIS — R3914 Feeling of incomplete bladder emptying: Secondary | ICD-10-CM | POA: Diagnosis not present

## 2020-10-29 DIAGNOSIS — L03114 Cellulitis of left upper limb: Secondary | ICD-10-CM | POA: Diagnosis not present

## 2020-11-24 DIAGNOSIS — R339 Retention of urine, unspecified: Secondary | ICD-10-CM | POA: Diagnosis not present

## 2020-11-24 DIAGNOSIS — R3914 Feeling of incomplete bladder emptying: Secondary | ICD-10-CM | POA: Diagnosis not present

## 2020-12-24 DIAGNOSIS — R3914 Feeling of incomplete bladder emptying: Secondary | ICD-10-CM | POA: Diagnosis not present

## 2020-12-24 DIAGNOSIS — R339 Retention of urine, unspecified: Secondary | ICD-10-CM | POA: Diagnosis not present

## 2020-12-30 DIAGNOSIS — R350 Frequency of micturition: Secondary | ICD-10-CM | POA: Diagnosis not present

## 2020-12-30 DIAGNOSIS — R3914 Feeling of incomplete bladder emptying: Secondary | ICD-10-CM | POA: Diagnosis not present

## 2020-12-30 DIAGNOSIS — R310 Gross hematuria: Secondary | ICD-10-CM | POA: Diagnosis not present

## 2021-01-13 DIAGNOSIS — Z8616 Personal history of COVID-19: Secondary | ICD-10-CM | POA: Diagnosis not present

## 2021-01-13 DIAGNOSIS — M810 Age-related osteoporosis without current pathological fracture: Secondary | ICD-10-CM | POA: Diagnosis not present

## 2021-01-13 DIAGNOSIS — E559 Vitamin D deficiency, unspecified: Secondary | ICD-10-CM | POA: Diagnosis not present

## 2021-01-13 DIAGNOSIS — M25512 Pain in left shoulder: Secondary | ICD-10-CM | POA: Diagnosis not present

## 2021-01-13 DIAGNOSIS — Z87440 Personal history of urinary (tract) infections: Secondary | ICD-10-CM | POA: Diagnosis not present

## 2021-01-13 DIAGNOSIS — R7303 Prediabetes: Secondary | ICD-10-CM | POA: Diagnosis not present

## 2021-01-13 DIAGNOSIS — Z8679 Personal history of other diseases of the circulatory system: Secondary | ICD-10-CM | POA: Diagnosis not present

## 2021-01-13 DIAGNOSIS — I10 Essential (primary) hypertension: Secondary | ICD-10-CM | POA: Diagnosis not present

## 2021-01-13 DIAGNOSIS — Z8619 Personal history of other infectious and parasitic diseases: Secondary | ICD-10-CM | POA: Diagnosis not present

## 2021-01-13 DIAGNOSIS — E782 Mixed hyperlipidemia: Secondary | ICD-10-CM | POA: Diagnosis not present

## 2021-01-14 DIAGNOSIS — R310 Gross hematuria: Secondary | ICD-10-CM | POA: Diagnosis not present

## 2021-01-24 DIAGNOSIS — R339 Retention of urine, unspecified: Secondary | ICD-10-CM | POA: Diagnosis not present

## 2021-01-24 DIAGNOSIS — R3914 Feeling of incomplete bladder emptying: Secondary | ICD-10-CM | POA: Diagnosis not present

## 2021-01-26 DIAGNOSIS — R350 Frequency of micturition: Secondary | ICD-10-CM | POA: Diagnosis not present

## 2021-01-26 DIAGNOSIS — D49511 Neoplasm of unspecified behavior of right kidney: Secondary | ICD-10-CM | POA: Diagnosis not present

## 2021-01-26 DIAGNOSIS — R3914 Feeling of incomplete bladder emptying: Secondary | ICD-10-CM | POA: Diagnosis not present

## 2021-01-31 ENCOUNTER — Telehealth: Payer: Self-pay | Admitting: Internal Medicine

## 2021-01-31 NOTE — Telephone Encounter (Signed)
   Name: Edwin Levine  DOB: [birth year]  MRN: [id number]  Primary Cardiologist: Dr. Lovena Le  Chart reviewed as part of pre-operative protocol coverage. Because of Edwin Levine's past medical history and time since last visit, he will require a follow-up visit in order to better assess preoperative cardiovascular risk.  Pre-op covering staff: - Please schedule appointment and call patient to inform them. If patient already had an upcoming appointment within acceptable timeframe, please add "pre-op clearance" to the appointment notes so provider is aware. - Please contact requesting surgeon's office via preferred method (i.e, phone, fax) to inform them of need for appointment prior to surgery.  If applicable, this message will also be routed to pharmacy pool and/or primary cardiologist for input on holding anticoagulant/antiplatelet agent as requested below so that this information is available to the clearing provider at time of patient's appointment.   [place], [place]  01/31/2021, 2:46 PM

## 2021-01-31 NOTE — Telephone Encounter (Signed)
   [facility] Pre-operative Risk Assessment    Patient Name: Edwin Levine  DOB: March 16, 1951 MRN: [id number]  HEARTCARE STAFF:  - IMPORTANT!!!!!! Under Visit Info/Reason for Call, type in Other and utilize the format Clearance MM/DD/YY or Clearance TBD. Do not use dashes or single digits. - Please review there is not already an duplicate clearance open for this procedure. - If request is for dental extraction, please clarify the # of teeth to be extracted. - If the patient is currently at the dentist's office, call Pre-Op Callback Staff (MA/nurse) to input urgent request.  - If the patient is not currently in the dentist office, please route to the Pre-Op pool.  Request for surgical clearance:  What type of surgery is being performed? Robotic laparoscopic right radical nephrectomy , possible open nephrectomy   When is this surgery scheduled? TBD  What type of clearance is required (medical clearance vs. Pharmacy clearance to hold med vs. Both)? Both  Are there any medications that need to be held prior to surgery and how long? Hold aspirin 5 days prior  Practice name and name of physician performing surgery? [facility], Dr. Rexene Alberts  What is the office phone number? [phone]   7.   What is the office fax number? [phone]  8.   Anesthesia type (None, local, MAC, general) ? general   Selena Zobro 01/31/2021, 11:36 AM  _________________________________________________________________   (provider comments below)

## 2021-01-31 NOTE — Telephone Encounter (Signed)
Message sent to [place], [place] scheduler to please reach out the with an appt for pre op clearance with Dr. Lovena Le or EP APP.

## 2021-02-01 NOTE — Telephone Encounter (Signed)
Appt with Oda Kilts, [facility] 02/02/21 for pre op clearance. I will forward notes to [facility] for upcoming appt. Will send FYI to surgeon's office pt has appt 02/02/21.

## 2021-02-01 NOTE — Progress Notes (Signed)
PCP:  Antony Contras, MD Primary Cardiologist: None Electrophysiologist: Cristopher Peru, MD   Edwin Levine is a 70 y.o. male seen today for Cristopher Peru, MD for cardiac clearance.  Since last being seen in our clinic the patient reports doing ok from a cardiac perspective. Her has renal CA and is awaiting surgery. He has known, occasional PVCs that are relatively asymptomatic  he denies chest pain, dyspnea, PND, orthopnea, nausea, vomiting, dizziness, syncope, edema, weight gain, or early satiety.  Past Medical History:  Diagnosis Date   GERD (gastroesophageal reflux disease)    Hyperlipidemia    Hypertension    Palpitations    Past Surgical History:  Procedure Laterality Date   ELECTROPHYSIOLOGIC STUDY N/A 02/16/2016   Procedure: SVT Ablation;  Surgeon: Evans Lance, MD;  Location: [facility];  Service: Cardiovascular;  Laterality: N/A;   HERNIA REPAIR      Current Outpatient Medications  Medication Sig Dispense Refill   acetaminophen (TYLENOL) 325 MG tablet Take 650 mg by mouth every 6 (six) hours as needed for mild pain.     atorvastatin (LIPITOR) 40 MG tablet Take 40 mg by mouth daily.     CINNAMON PO Take 2,000 mg by mouth daily.     Cyanocobalamin (VITAMIN B-12 PO) Take 1 tablet by mouth daily.     finasteride (PROSCAR) 5 MG tablet Take 5 mg by mouth daily.     lisinopril-hydrochlorothiazide (ZESTORETIC) 20-25 MG tablet Take 1 tablet by mouth daily.      Multiple Vitamins-Minerals (STRESS TAB NF PO) Take 1 tablet by mouth daily.     S-Adenosylmethionine-B6-B12-FA (MOOD PLUS STRESS RELIEF PO) Take 1 tablet by mouth daily.     tamsulosin (FLOMAX) 0.4 MG CAPS capsule Take 0.8 mg by mouth daily after supper.     Ascorbic Acid (VITAMIN C) 1000 MG tablet Take 1,000 mg by mouth daily. (Patient not taking: Reported on 02/02/2021)     Calcium Carb-Cholecalciferol (CALCIUM 600 + D PO) Take by mouth daily. (Patient not taking: Reported on 02/02/2021)      sulfamethoxazole-trimethoprim (BACTRIM) 400-80 MG tablet Take 1 tablet by mouth at bedtime. (Patient not taking: Reported on 02/02/2021)     No current facility-administered medications for this visit.    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   Social Determinants of Health   Financial Resource Strain: Not on file  Food Insecurity: Not on file  Transportation Needs: Not on file  Physical Activity: Not on file  Stress: Not on file  Social Connections: Not on file  Intimate Partner Violence: Not on file     Review of Systems: All other systems reviewed and are otherwise negative except as noted above.  Physical Exam: Vitals:   02/02/21 0849  BP: 128/70  Pulse: (!) 57  SpO2: 99%  Weight: 202 lb 6.4  oz (91.8 kg)  Height: 5\' 9"  (1.753 m)    GEN- The patient is well appearing, alert and oriented x 3 today.   HEENT: normocephalic, atraumatic; sclera clear, conjunctiva pink; hearing intact; oropharynx clear; neck supple, no JVP Lymph- no cervical lymphadenopathy Lungs- Clear to ausculation bilaterally, normal work of breathing.  No wheezes, rales, rhonchi Heart- Regular rate and rhythm, no murmurs, rubs or gallops, PMI not laterally displaced GI- soft, non-tender, non-distended, bowel sounds present, no hepatosplenomegaly Extremities- no clubbing, cyanosis, or edema; DP/PT/radial pulses 2+ bilaterally MS- no significant deformity or  atrophy Skin- warm and dry, no rash or lesion Psych- euthymic mood, full affect Neuro- strength and sensation are intact  EKG is ordered. Personal review of EKG from today shows sinus bradycardia with slight 1st degree AV block and LBBB at 158 ms.   Additional studies reviewed include: Previous EP office notes.   Assessment and Plan:  1. SVT s/p ablation Maintaining NSR by EKG  2. HTN Stable on current regimen   3. Cardiac clearance for Robotic laparoscopic right radical nephrectomy , possible open nephrectomy  Echo 01/2016 LVEF 55-60% Revised Cardiac Risk Index (Lee Criteria) Gives approximately 6% 30 day risk of MI, death, or cardiac arrest.  He is OK to proceed without further cardiac work up. He understands to call us if his symptoms change prior to his procedure.  He does not have CAD and may hold ASA prior to surgery.   Shirley Friar, PA-C  02/02/21 8:53 AM

## 2021-02-02 ENCOUNTER — Ambulatory Visit (INDEPENDENT_AMBULATORY_CARE_PROVIDER_SITE_OTHER): Payer: Medicare Other | Admitting: Student

## 2021-02-02 ENCOUNTER — Encounter: Payer: Self-pay | Admitting: Student

## 2021-02-02 ENCOUNTER — Other Ambulatory Visit: Payer: Self-pay

## 2021-02-02 VITALS — BP 128/70 | HR 57 | Ht 69.0 in | Wt 202.4 lb

## 2021-02-02 DIAGNOSIS — Z01810 Encounter for preprocedural cardiovascular examination: Secondary | ICD-10-CM

## 2021-02-02 DIAGNOSIS — I471 Supraventricular tachycardia: Secondary | ICD-10-CM | POA: Diagnosis not present

## 2021-02-02 DIAGNOSIS — I10 Essential (primary) hypertension: Secondary | ICD-10-CM

## 2021-02-02 DIAGNOSIS — Z01818 Encounter for other preprocedural examination: Secondary | ICD-10-CM

## 2021-02-02 NOTE — Patient Instructions (Signed)
Medication Instructions:  Your physician recommends that you continue on your current medications as directed. Please refer to the Current Medication list given to you today.  *If you need a refill on your cardiac medications before your next appointment, please call your pharmacy*   Lab Work: None If you have labs (blood work) drawn today and your tests are completely normal, you will receive your results only by: [place] (if you have MyChart) OR A paper copy in the mail If you have any lab test that is abnormal or we need to change your treatment, we will call you to review the results.   Follow-Up: At [facility], you and your health needs are our priority.  As part of our continuing mission to provide you with exceptional heart care, we have created designated Provider Care Teams.  These Care Teams include your primary Cardiologist (physician) and Advanced Practice Providers (APPs -  Physician Assistants and Nurse Practitioners) who all work together to provide you with the care you need, when you need it.  We recommend signing up for the patient portal called "MyChart".  Sign up information is provided on this After Visit Summary.  MyChart is used to connect with patients for Virtual Visits (Telemedicine).  Patients are able to view lab/test results, encounter notes, upcoming appointments, etc.  Non-urgent messages can be sent to your provider as well.   To learn more about what you can do with MyChart, go to [email or web address].    Your next appointment:   1 year(s)  The format for your next appointment:   In Person  Provider:   You may see Cristopher Peru, MD or one of the following Advanced Practice Providers on your designated Care Team:   Tommye Standard, [place] "[facility]" [place], [place]

## 2021-02-09 ENCOUNTER — Other Ambulatory Visit: Payer: Self-pay | Admitting: Urology

## 2021-02-15 ENCOUNTER — Other Ambulatory Visit (HOSPITAL_COMMUNITY): Payer: Self-pay | Admitting: Urology

## 2021-02-15 ENCOUNTER — Ambulatory Visit (HOSPITAL_COMMUNITY)
Admission: RE | Admit: 2021-02-15 | Discharge: 2021-02-15 | Disposition: A | Discharge status: Home / Self Care | Payer: Medicare Other | Source: Ambulatory Visit | Attending: Urology | Admitting: Urology

## 2021-02-15 DIAGNOSIS — D49511 Neoplasm of unspecified behavior of right kidney: Secondary | ICD-10-CM

## 2021-02-15 DIAGNOSIS — Z01818 Encounter for other preprocedural examination: Secondary | ICD-10-CM | POA: Diagnosis not present

## 2021-02-22 DIAGNOSIS — R3914 Feeling of incomplete bladder emptying: Secondary | ICD-10-CM | POA: Diagnosis not present

## 2021-02-22 DIAGNOSIS — R339 Retention of urine, unspecified: Secondary | ICD-10-CM | POA: Diagnosis not present

## 2021-03-01 DIAGNOSIS — R310 Gross hematuria: Secondary | ICD-10-CM | POA: Diagnosis not present

## 2021-03-01 DIAGNOSIS — D49511 Neoplasm of unspecified behavior of right kidney: Secondary | ICD-10-CM | POA: Diagnosis not present

## 2021-03-03 ENCOUNTER — Other Ambulatory Visit (HOSPITAL_COMMUNITY): Payer: Self-pay

## 2021-03-03 NOTE — Progress Notes (Signed)
DUE TO COVID-19 ONLY ONE VISITOR IS ALLOWED TO COME WITH YOU AND STAY IN THE WAITING ROOM ONLY DURING PRE OP AND PROCEDURE DAY OF SURGERY. THE 1 VISITOR  MAY VISIT WITH YOU AFTER SURGERY IN YOUR PRIVATE ROOM DURING VISITING HOURS ONLY!  YOU NEED TO HAVE A COVID 19 TEST ON____11/25/22 ___ @_______ , THIS TEST MUST BE DONE BEFORE SURGERY,  COVID TESTING SITE IS AT [place]. PLEASE REMAIN IN YOUR CAR THIS IS A DRIVER UP TEST. AFTER YOUR COVID TEST PLEASE WEAR A MASK OUT IN PUBLIC AND SOCIAL DISTANCE AND [place] YOUR HANDS FREQUENTLY. PLEASE ASK ALL YOUR CLOSE CONTACTS TO WEAR A MASK OUT IN PUBLIC AND SOCIAL DISTANCE AND [place] HANDS FREQUENTLY ALSO.               Edwin Levine Shall  03/03/2021   Your procedure is scheduled on:     03/21/21   Report to [facility] Main  Entrance   Report to admitting at    [phone]     Call this number if you have problems the morning of surgery [phone]    Remember: Do not eat food , candy gum or mints :After Midnight. You may have clear liquids from midnight until   0415am __ Clear liquid diet the day before surgery    CLEAR LIQUID DIET   Foods Allowed                                                                       Coffee and tea, regular and decaf                              Plain Jell-O any favor except red or purple                                            Fruit ices (not with fruit pulp)                                      Iced Popsicles                                     Carbonated beverages, regular and diet                                    Cranberry, grape and apple juices Sports drinks like Gatorade Lightly seasoned clear broth or consume(fat free) Sugar   _____________________________________________________________________    BRUSH YOUR TEETH MORNING OF SURGERY AND RINSE YOUR MOUTH OUT, NO CHEWING GUM CANDY OR MINTS.     Take these medicines the morning of surgery with A SIP OF WATER:   proscar, flomax   DO NOT TAKE ANY DIABETIC MEDICATIONS DAY OF YOUR SURGERY  You may not have any metal on your body including hair pins and              piercings  Do not wear jewelry, make-up, lotions, powders or perfumes, deodorant             Do not wear nail polish on your fingernails.  Do not shave  48 hours prior to surgery.              Men may shave face and neck.   Do not bring valuables to the hospital. [place].  Contacts, dentures or bridgework may not be worn into surgery.  Leave suitcase in the car. After surgery it may be brought to your room.     Patients discharged the day of surgery will not be allowed to drive home. IF YOU ARE HAVING SURGERY AND GOING HOME THE SAME DAY, YOU MUST HAVE AN ADULT TO DRIVE YOU HOME AND BE WITH YOU FOR 24 HOURS. YOU MAY GO HOME BY TAXI OR UBER OR ORTHERWISE, BUT AN ADULT MUST ACCOMPANY YOU HOME AND STAY WITH YOU FOR 24 HOURS.  Name and phone number of your driver:  Special Instructions: N/A              Please read over the following fact sheets you were given: _____________________________________________________________________  [facility] - Preparing for Surgery Before surgery, you can play an important role.  Because skin is not sterile, your skin needs to be as free of germs as possible.  You can reduce the number of germs on your skin by washing with CHG (chlorahexidine gluconate) soap before surgery.  CHG is an antiseptic cleaner which kills germs and bonds with the skin to continue killing germs even after washing. Please DO NOT use if you have an allergy to CHG or antibacterial soaps.  If your skin becomes reddened/irritated stop using the CHG and inform your nurse when you arrive at Short Stay. Do not shave (including legs and underarms) for at least 48 hours prior to the first CHG shower.  You may shave your face/neck. Please follow these instructions  carefully:  1.  Shower with CHG Soap the night before surgery and the  morning of Surgery.  2.  If you choose to wash your hair, wash your hair first as usual with your  normal  shampoo.  3.  After you shampoo, rinse your hair and body thoroughly to remove the  shampoo.                           4.  Use CHG as you would any other liquid soap.  You can apply chg directly  to the skin and wash                       Gently with a scrungie or clean washcloth.  5.  Apply the CHG Soap to your body ONLY FROM THE NECK DOWN.   Do not use on face/ open                           Wound or open sores. Avoid contact with eyes, ears mouth and genitals (private parts).  Wash face,  Genitals (private parts) with your normal soap.             6.  Wash thoroughly, paying special attention to the area where your surgery  will be performed.  7.  Thoroughly rinse your body with warm water from the neck down.  8.  DO NOT shower/wash with your normal soap after using and rinsing off  the CHG Soap.                9.  Pat yourself dry with a clean towel.            10.  Wear clean pajamas.            11.  Place clean sheets on your bed the night of your first shower and do not  sleep with pets. Day of Surgery : Do not apply any lotions/deodorants the morning of surgery.  Please wear clean clothes to the hospital/surgery center.  FAILURE TO FOLLOW THESE INSTRUCTIONS MAY RESULT IN THE CANCELLATION OF YOUR SURGERY PATIENT SIGNATURE_________________________________  NURSE SIGNATURE__________________________________  ________________________________________________________________________

## 2021-03-03 NOTE — Progress Notes (Addendum)
Anesthesia Review:  PCP: DR Antony Contras  Cardiologist :02/02/21- -Terressa Koyanagi  Chest x-ray : 02/17/21-2V  EKG : 02/02/21  2017-Ablation  Echo : Stress test: Cardiac Cath :  Activity level: can do a flgiht of stairs without difficulty  Sleep Study/ CPAP : none  Fasting Blood Sugar :      / Checks Blood Sugar -- times a day:   Blood Thinner/ Instructions /Last Dose: ASA / Instructions/ Last Dose :   Prediabets- does not check glucose at home  Hgba1c- 03/08/21- 5.9  Covid test day of surgery

## 2021-03-08 ENCOUNTER — Encounter (HOSPITAL_COMMUNITY): Payer: Self-pay

## 2021-03-08 ENCOUNTER — Other Ambulatory Visit: Payer: Self-pay

## 2021-03-08 ENCOUNTER — Encounter (HOSPITAL_COMMUNITY)
Admission: RE | Admit: 2021-03-08 | Discharge: 2021-03-08 | Disposition: A | Discharge status: Home / Self Care | Payer: Medicare Other | Source: Ambulatory Visit | Attending: Urology | Admitting: Urology

## 2021-03-08 VITALS — BP 142/80 | HR 68 | Temp 97.9°F | Resp 16 | Ht 69.0 in | Wt 198.0 lb

## 2021-03-08 DIAGNOSIS — I493 Ventricular premature depolarization: Secondary | ICD-10-CM | POA: Diagnosis not present

## 2021-03-08 DIAGNOSIS — K219 Gastro-esophageal reflux disease without esophagitis: Secondary | ICD-10-CM | POA: Insufficient documentation

## 2021-03-08 DIAGNOSIS — E139 Other specified diabetes mellitus without complications: Secondary | ICD-10-CM | POA: Diagnosis not present

## 2021-03-08 DIAGNOSIS — I10 Essential (primary) hypertension: Secondary | ICD-10-CM | POA: Diagnosis not present

## 2021-03-08 DIAGNOSIS — N2889 Other specified disorders of kidney and ureter: Secondary | ICD-10-CM | POA: Insufficient documentation

## 2021-03-08 DIAGNOSIS — I471 Supraventricular tachycardia: Secondary | ICD-10-CM | POA: Insufficient documentation

## 2021-03-08 DIAGNOSIS — Z01812 Encounter for preprocedural laboratory examination: Secondary | ICD-10-CM | POA: Diagnosis not present

## 2021-03-08 HISTORY — DX: Prediabetes: R73.03

## 2021-03-08 HISTORY — DX: Malignant (primary) neoplasm, unspecified: C80.1

## 2021-03-08 LAB — BASIC METABOLIC PANEL
Anion gap: 6 (ref 5–15)
BUN: 14 mg/dL (ref 8–23)
CO2: 29 mmol/L (ref 22–32)
Calcium: 9.5 mg/dL (ref 8.9–10.3)
Chloride: 103 mmol/L (ref 98–111)
Creatinine, Ser: 0.89 mg/dL (ref 0.61–1.24)
GFR, Estimated: >60 mL/min (ref >60)
Glucose, Bld: 119 mg/dL — ABNORMAL HIGH (ref 70–99)
Potassium: 4 mmol/L (ref 3.5–5.1)
Sodium: 138 mmol/L (ref 135–145)

## 2021-03-08 LAB — CBC
HCT: 40.7 % (ref 39.0–52.0)
Hemoglobin: 13.4 g/dL (ref 13.0–17.0)
MCH: 31.4 pg (ref 26.0–34.0)
MCHC: 32.9 g/dL (ref 30.0–36.0)
MCV: 95.3 fL (ref 80.0–100.0)
Platelets: 237 10*3/uL (ref 150–400)
RBC: 4.27 MIL/uL (ref 4.22–5.81)
RDW: 12.1 % (ref 11.5–15.5)
WBC: 6.1 10*3/uL (ref 4.0–10.5)
nRBC: 0 % (ref 0.0–0.2)

## 2021-03-08 LAB — TYPE AND SCREEN
ABO/RH(D): AB POS
Antibody Screen: NEGATIVE

## 2021-03-08 LAB — HEMOGLOBIN A1C
Hgb A1c MFr Bld: 5.9 % — ABNORMAL HIGH (ref 4.8–5.6)
Mean Plasma Glucose: 122.63 mg/dL

## 2021-03-08 LAB — GLUCOSE, CAPILLARY: Glucose-Capillary: 118 mg/dL — ABNORMAL HIGH (ref 70–99)

## 2021-03-09 NOTE — Anesthesia Preprocedure Evaluation (Addendum)
Anesthesia Evaluation  Patient identified by MRN, date of birth, ID band Patient awake    Reviewed: Allergy & Precautions, NPO status , Patient's Chart, lab work & pertinent test results  Airway Mallampati: I  TM Distance: >3 FB Neck ROM: Full    Dental no notable dental hx. (+) Teeth Intact, Dental Advisory Given   Pulmonary neg pulmonary ROS,  + Covid test 11/28 assymptomatic   Pulmonary exam normal breath sounds clear to auscultation       Cardiovascular hypertension, Pt. on medications Normal cardiovascular exam Rhythm:Regular Rate:Normal  EF 55- 60%   Neuro/Psych negative neurological ROS  negative psych ROS   GI/Hepatic Neg liver ROS,   Endo/Other  negative endocrine ROS  Renal/GU Lab Results      Component                Value               Date                      CREATININE               0.89                03/08/2021                BUN                      14                  03/08/2021                NA                       138                 03/08/2021                K                        4.0                 03/08/2021                CL                       103                 03/08/2021                CO2                      29                  03/08/2021                Musculoskeletal negative musculoskeletal ROS (+)   Abdominal   Peds  Hematology Lab Results      Component                Value               Date                      WBC  6.1                 03/08/2021                HGB                      13.4                03/08/2021                HCT                      40.7                03/08/2021                MCV                      95.3                03/08/2021                PLT                      237                 03/08/2021              Anesthesia Other Findings   Reproductive/Obstetrics                            Anesthesia Physical Anesthesia Plan  ASA: 3  Anesthesia Plan: General   Post-op Pain Management:    Induction: Intravenous  PONV Risk Score and Plan: 3 and Treatment may vary due to age or medical condition, Midazolam, Ondansetron and Dexamethasone  Airway Management Planned: Oral ETT  Additional Equipment: None  Intra-op Plan:   Post-operative Plan: Extubation in OR  Informed Consent: I have reviewed the patients History and Physical, chart, labs and discussed the procedure including the risks, benefits and alternatives for the proposed anesthesia with the patient or authorized representative who has indicated his/her understanding and acceptance.     Dental advisory given  Plan Discussed with: CRNA  Anesthesia Plan Comments: (See PAT note 03/08/2021, Konrad Felix Ward, PA-C  GA w lidocaine fusion )      Anesthesia Quick Evaluation

## 2021-03-09 NOTE — Progress Notes (Signed)
Anesthesia Chart Review   Case: [id number] Date/Time: 03/21/21 0700   Procedure: XI ROBOTIC ASSISTED LAPAROSCOPIC RADICAL NEPHRECTOMY/ POSSIBLE RIGHT OPEN NEPHRECTOMY (Right)   Anesthesia type: General   Pre-op diagnosis: RIGHT RENAL MASS   Location: [facility] ROOM 03 / [facility]   Surgeons: Janith Lima, MD       DISCUSSION:70 y.o. never smoker with h/o HTN, GERD, PVCs, SVT s/p ablation, right renal mass scheduled for above procedure 03/21/2021 with Dr. Rexene Alberts.   Pt last seen by cardiology 02/02/2021. Per OV note, "Echo 01/2016 LVEF 55-60% Revised Cardiac Risk Index [place] Criteria) Gives approximately 6% 30 day risk of MI, death, or cardiac arrest.  He is OK to proceed without further cardiac work up. He understands to call us if his symptoms change prior to his procedure.  He does not have CAD and may hold ASA prior to surgery."  Anticipate pt can proceed with planned procedure barring acute status change.   VS: BP (!) 142/80   Pulse 68   Temp 36.6 C (Oral)   Resp 16   Ht 5\' 9"  (1.753 m)   Wt 89.8 kg   SpO2 99%   BMI 29.24 kg/m   PROVIDERS: Antony Contras, MD is PCP   Cristopher Peru, MD is Cardiologist  LABS: Labs reviewed: Acceptable for surgery. (all labs ordered are listed, but only abnormal results are displayed)  Labs Reviewed  BASIC METABOLIC PANEL - Abnormal; Notable for the following components:      Result Value   Glucose, Bld 119 (*)    All other components within normal limits  HEMOGLOBIN A1C - Abnormal; Notable for the following components:   Hgb A1c MFr Bld 5.9 (*)    All other components within normal limits  GLUCOSE, CAPILLARY - Abnormal; Notable for the following components:   Glucose-Capillary 118 (*)    All other components within normal limits  CBC  TYPE AND SCREEN     IMAGES:   EKG:   CV: Echo 02/16/2016 Study Conclusions   - Left ventricle: The cavity size was normal. There was mild    concentric hypertrophy. Systolic function was  normal. The    estimated ejection fraction was in the range of 55% to 60%. Wall    motion was normal; there were no regional wall motion    abnormalities. Left ventricular diastolic function parameters    were normal.  - Aortic valve: There was trivial regurgitation.  - Mitral valve: There was trivial regurgitation.  - Tricuspid valve: There was trivial regurgitation. Past Medical History:  Diagnosis Date   Cancer ([place])    right kidney   GERD (gastroesophageal reflux disease)    Hyperlipidemia    Hypertension    Palpitations    Pre-diabetes     Past Surgical History:  Procedure Laterality Date   ELECTROPHYSIOLOGIC STUDY N/A 02/16/2016   Procedure: SVT Ablation;  Surgeon: Evans Lance, MD;  Location: [facility];  Service: Cardiovascular;  Laterality: N/A;   HERNIA REPAIR      MEDICATIONS:  acetaminophen (TYLENOL) 325 MG tablet   atorvastatin (LIPITOR) 40 MG tablet   CINNAMON PO   Cyanocobalamin (VITAMIN B-12 PO)   finasteride (PROSCAR) 5 MG tablet   lisinopril-hydrochlorothiazide (ZESTORETIC) 20-25 MG tablet   Menthol, Topical Analgesic, (BENGAY EX)   tamsulosin (FLOMAX) 0.4 MG CAPS capsule   No current facility-administered medications for this encounter.   Konrad Felix Ward, PA-C [facility] [phone]

## 2021-03-21 ENCOUNTER — Other Ambulatory Visit: Payer: Self-pay

## 2021-03-21 ENCOUNTER — Inpatient Hospital Stay (HOSPITAL_COMMUNITY)
Admission: RE | Admit: 2021-03-21 | Discharge: 2021-03-23 | DRG: 656 | Disposition: A | Discharge status: Home / Self Care | Payer: Medicare Other | Source: Ambulatory Visit | Attending: Urology | Admitting: Urology

## 2021-03-21 ENCOUNTER — Encounter (HOSPITAL_COMMUNITY): Payer: Self-pay | Admitting: Urology

## 2021-03-21 ENCOUNTER — Inpatient Hospital Stay (HOSPITAL_COMMUNITY): Payer: Medicare Other | Admitting: Physician Assistant

## 2021-03-21 ENCOUNTER — Inpatient Hospital Stay (HOSPITAL_COMMUNITY): Payer: Medicare Other | Admitting: Anesthesiology

## 2021-03-21 ENCOUNTER — Encounter (HOSPITAL_COMMUNITY)
Admission: RE | Disposition: A | Discharge status: Home / Self Care | Payer: Self-pay | Source: Ambulatory Visit | Attending: Urology

## 2021-03-21 DIAGNOSIS — R3915 Urgency of urination: Secondary | ICD-10-CM | POA: Diagnosis not present

## 2021-03-21 DIAGNOSIS — R7303 Prediabetes: Secondary | ICD-10-CM | POA: Diagnosis not present

## 2021-03-21 DIAGNOSIS — N210 Calculus in bladder: Secondary | ICD-10-CM | POA: Diagnosis not present

## 2021-03-21 DIAGNOSIS — N1330 Unspecified hydronephrosis: Secondary | ICD-10-CM | POA: Diagnosis not present

## 2021-03-21 DIAGNOSIS — R339 Retention of urine, unspecified: Secondary | ICD-10-CM | POA: Diagnosis not present

## 2021-03-21 DIAGNOSIS — N401 Enlarged prostate with lower urinary tract symptoms: Secondary | ICD-10-CM | POA: Diagnosis present

## 2021-03-21 DIAGNOSIS — U071 COVID-19: Secondary | ICD-10-CM | POA: Diagnosis not present

## 2021-03-21 DIAGNOSIS — R310 Gross hematuria: Secondary | ICD-10-CM | POA: Diagnosis present

## 2021-03-21 DIAGNOSIS — E785 Hyperlipidemia, unspecified: Secondary | ICD-10-CM | POA: Diagnosis present

## 2021-03-21 DIAGNOSIS — D72828 Other elevated white blood cell count: Secondary | ICD-10-CM | POA: Diagnosis not present

## 2021-03-21 DIAGNOSIS — C641 Malignant neoplasm of right kidney, except renal pelvis: Secondary | ICD-10-CM | POA: Diagnosis not present

## 2021-03-21 DIAGNOSIS — Z5331 Laparoscopic surgical procedure converted to open procedure: Secondary | ICD-10-CM

## 2021-03-21 DIAGNOSIS — I10 Essential (primary) hypertension: Secondary | ICD-10-CM | POA: Diagnosis not present

## 2021-03-21 DIAGNOSIS — K219 Gastro-esophageal reflux disease without esophagitis: Secondary | ICD-10-CM | POA: Diagnosis not present

## 2021-03-21 DIAGNOSIS — N2889 Other specified disorders of kidney and ureter: Secondary | ICD-10-CM | POA: Diagnosis not present

## 2021-03-21 DIAGNOSIS — D49511 Neoplasm of unspecified behavior of right kidney: Secondary | ICD-10-CM | POA: Diagnosis present

## 2021-03-21 DIAGNOSIS — E876 Hypokalemia: Secondary | ICD-10-CM | POA: Diagnosis not present

## 2021-03-21 DIAGNOSIS — Z87891 Personal history of nicotine dependence: Secondary | ICD-10-CM

## 2021-03-21 DIAGNOSIS — Z801 Family history of malignant neoplasm of trachea, bronchus and lung: Secondary | ICD-10-CM

## 2021-03-21 DIAGNOSIS — N3000 Acute cystitis without hematuria: Secondary | ICD-10-CM | POA: Diagnosis not present

## 2021-03-21 DIAGNOSIS — R350 Frequency of micturition: Secondary | ICD-10-CM | POA: Diagnosis not present

## 2021-03-21 DIAGNOSIS — Z8249 Family history of ischemic heart disease and other diseases of the circulatory system: Secondary | ICD-10-CM

## 2021-03-21 DIAGNOSIS — Z01818 Encounter for other preprocedural examination: Secondary | ICD-10-CM

## 2021-03-21 HISTORY — PX: ROBOT ASSISTED LAPAROSCOPIC NEPHRECTOMY: SHX5140

## 2021-03-21 LAB — BASIC METABOLIC PANEL
Anion gap: 12 (ref 5–15)
BUN: 18 mg/dL (ref 8–23)
CO2: 22 mmol/L (ref 22–32)
Calcium: 8.9 mg/dL (ref 8.9–10.3)
Chloride: 102 mmol/L (ref 98–111)
Creatinine, Ser: 1.26 mg/dL — ABNORMAL HIGH (ref 0.61–1.24)
GFR, Estimated: >60 mL/min (ref >60)
Glucose, Bld: 189 mg/dL — ABNORMAL HIGH (ref 70–99)
Potassium: 3.7 mmol/L (ref 3.5–5.1)
Sodium: 136 mmol/L (ref 135–145)

## 2021-03-21 LAB — ABO/RH: ABO/RH(D): AB POS

## 2021-03-21 LAB — GLUCOSE, CAPILLARY: Glucose-Capillary: 156 mg/dL — ABNORMAL HIGH (ref 70–99)

## 2021-03-21 LAB — HEMOGLOBIN AND HEMATOCRIT, BLOOD
HCT: 36.6 % — ABNORMAL LOW (ref 39.0–52.0)
Hemoglobin: 12 g/dL — ABNORMAL LOW (ref 13.0–17.0)

## 2021-03-21 LAB — SARS CORONAVIRUS 2 BY RT PCR (HOSPITAL ORDER, PERFORMED IN ~~LOC~~ HOSPITAL LAB): SARS Coronavirus 2: POSITIVE — AB

## 2021-03-21 LAB — MRSA NEXT GEN BY PCR, NASAL: MRSA by PCR Next Gen: NOT DETECTED

## 2021-03-21 SURGERY — NEPHRECTOMY, RADICAL, ROBOT-ASSISTED, LAPAROSCOPIC, ADULT
Anesthesia: General | Laterality: Right

## 2021-03-21 MED ORDER — EPHEDRINE SULFATE-NACL 50-0.9 MG/10ML-% IV SOSY
PREFILLED_SYRINGE | INTRAVENOUS | Status: DC | PRN
  Administered 2021-03-21: 10 mg via INTRAVENOUS

## 2021-03-21 MED ORDER — KETAMINE HCL 10 MG/ML IJ SOLN
INTRAMUSCULAR | Status: DC | PRN
  Administered 2021-03-21: 40 mg via INTRAVENOUS
  Administered 2021-03-21: 10 mg via INTRAVENOUS

## 2021-03-21 MED ORDER — CEFAZOLIN SODIUM-DEXTROSE 1-4 GM/50ML-% IV SOLN
1.0000 g | Freq: Three times a day (TID) | INTRAVENOUS | Status: AC
  Administered 2021-03-21 – 2021-03-22 (×2): 1 g via INTRAVENOUS
  Filled 2021-03-21 (×2): qty 50

## 2021-03-21 MED ORDER — ONDANSETRON HCL 4 MG/2ML IJ SOLN
INTRAMUSCULAR | Status: AC
  Filled 2021-03-21: qty 2

## 2021-03-21 MED ORDER — VASOPRESSIN 20 UNIT/ML IV SOLN
INTRAVENOUS | Status: AC
  Filled 2021-03-21: qty 1

## 2021-03-21 MED ORDER — DIPHENHYDRAMINE HCL 12.5 MG/5ML PO ELIX: 12.5000 mg | ORAL_SOLUTION | Freq: Four times a day (QID) | ORAL | Status: DC | PRN

## 2021-03-21 MED ORDER — ONDANSETRON HCL 4 MG/2ML IJ SOLN: 4.0000 mg | Freq: Four times a day (QID) | INTRAMUSCULAR | Status: DC | PRN

## 2021-03-21 MED ORDER — FINASTERIDE 5 MG PO TABS
5.0000 mg | ORAL_TABLET | Freq: Every day | ORAL | Status: DC
  Administered 2021-03-22 – 2021-03-23 (×2): 5 mg via ORAL
  Filled 2021-03-21 (×2): qty 1

## 2021-03-21 MED ORDER — BUPIVACAINE LIPOSOME 1.3 % IJ SUSP
INTRAMUSCULAR | Status: DC | PRN
  Administered 2021-03-21: 20 mL

## 2021-03-21 MED ORDER — 0.9 % SODIUM CHLORIDE (POUR BTL) OPTIME
TOPICAL | Status: DC | PRN
  Administered 2021-03-21: 10:00:00 2000 mL

## 2021-03-21 MED ORDER — LIDOCAINE HCL (PF) 2 % IJ SOLN
INTRAMUSCULAR | Status: AC
  Filled 2021-03-21: qty 5

## 2021-03-21 MED ORDER — STERILE WATER FOR IRRIGATION IR SOLN
Status: DC | PRN
  Administered 2021-03-21: 1000 mL

## 2021-03-21 MED ORDER — VASOPRESSIN 20 UNIT/ML IV SOLN
INTRAVENOUS | Status: DC | PRN
  Administered 2021-03-21 (×3): 1 [IU] via INTRAVENOUS

## 2021-03-21 MED ORDER — ORAL CARE MOUTH RINSE
15.0000 mL | Freq: Two times a day (BID) | OROMUCOSAL | Status: DC
  Administered 2021-03-22 – 2021-03-23 (×2): 15 mL via OROMUCOSAL

## 2021-03-21 MED ORDER — MIDAZOLAM HCL 2 MG/2ML IJ SOLN
INTRAMUSCULAR | Status: AC
  Filled 2021-03-21: qty 2

## 2021-03-21 MED ORDER — VASOPRESSIN 20 UNITS/100 ML INFUSION FOR SHOCK
0.0000 [IU]/min | INTRAVENOUS | Status: DC
  Filled 2021-03-21: qty 100

## 2021-03-21 MED ORDER — SODIUM CHLORIDE (PF) 0.9 % IJ SOLN
INTRAMUSCULAR | Status: AC
  Filled 2021-03-21: qty 40

## 2021-03-21 MED ORDER — HYDROMORPHONE HCL 1 MG/ML IJ SOLN
INTRAMUSCULAR | Status: AC
  Filled 2021-03-21: qty 2

## 2021-03-21 MED ORDER — AMISULPRIDE (ANTIEMETIC) 5 MG/2ML IV SOLN
INTRAVENOUS | Status: AC
  Filled 2021-03-21: qty 4

## 2021-03-21 MED ORDER — OXYCODONE HCL 5 MG/5ML PO SOLN
5.0000 mg | Freq: Once | ORAL | Status: DC | PRN
Start: 2021-03-21 — End: 2021-03-21

## 2021-03-21 MED ORDER — FENTANYL CITRATE (PF) 250 MCG/5ML IJ SOLN
INTRAMUSCULAR | Status: DC | PRN
  Administered 2021-03-21 (×4): 50 ug via INTRAVENOUS
  Administered 2021-03-21: 100 ug via INTRAVENOUS
  Administered 2021-03-21: 50 ug via INTRAVENOUS

## 2021-03-21 MED ORDER — HYDROMORPHONE HCL 2 MG/ML IJ SOLN
INTRAMUSCULAR | Status: AC
  Filled 2021-03-21: qty 1

## 2021-03-21 MED ORDER — SODIUM CHLORIDE (PF) 0.9 % IJ SOLN
INTRAMUSCULAR | Status: AC
  Filled 2021-03-21: qty 20

## 2021-03-21 MED ORDER — LACTATED RINGERS IR SOLN
Status: DC | PRN
  Administered 2021-03-21: 1000 mL

## 2021-03-21 MED ORDER — PHENYLEPHRINE 40 MCG/ML (10ML) SYRINGE FOR IV PUSH (FOR BLOOD PRESSURE SUPPORT)
PREFILLED_SYRINGE | INTRAVENOUS | Status: DC | PRN
  Administered 2021-03-21: 80 ug via INTRAVENOUS
  Administered 2021-03-21: 120 ug via INTRAVENOUS
  Administered 2021-03-21: 80 ug via INTRAVENOUS

## 2021-03-21 MED ORDER — AMISULPRIDE (ANTIEMETIC) 5 MG/2ML IV SOLN: 10.0000 mg | Freq: Once | INTRAVENOUS | Status: DC | PRN

## 2021-03-21 MED ORDER — ALBUMIN HUMAN 5 % IV SOLN: INTRAVENOUS | Status: DC | PRN

## 2021-03-21 MED ORDER — FENTANYL CITRATE (PF) 100 MCG/2ML IJ SOLN
INTRAMUSCULAR | Status: AC
  Filled 2021-03-21: qty 2

## 2021-03-21 MED ORDER — SODIUM CHLORIDE (PF) 0.9 % IJ SOLN
INTRAMUSCULAR | Status: DC | PRN
  Administered 2021-03-21: 20 mL

## 2021-03-21 MED ORDER — SENNA 8.6 MG PO TABS
1.0000 | ORAL_TABLET | Freq: Two times a day (BID) | ORAL | Status: DC
  Administered 2021-03-21 – 2021-03-23 (×4): 8.6 mg via ORAL
  Filled 2021-03-21 (×3): qty 1

## 2021-03-21 MED ORDER — DOCUSATE SODIUM 100 MG PO CAPS
100.0000 mg | ORAL_CAPSULE | Freq: Two times a day (BID) | ORAL | Status: DC
  Administered 2021-03-21 – 2021-03-23 (×4): 100 mg via ORAL
  Filled 2021-03-21 (×3): qty 1

## 2021-03-21 MED ORDER — HYDROMORPHONE HCL 1 MG/ML IJ SOLN
INTRAMUSCULAR | Status: DC | PRN
  Administered 2021-03-21 (×3): .4 mg via INTRAVENOUS

## 2021-03-21 MED ORDER — ACETAMINOPHEN 10 MG/ML IV SOLN
1000.0000 mg | Freq: Four times a day (QID) | INTRAVENOUS | Status: AC
  Administered 2021-03-21 – 2021-03-22 (×3): 1000 mg via INTRAVENOUS
  Filled 2021-03-21 (×2): qty 100

## 2021-03-21 MED ORDER — DEXAMETHASONE SODIUM PHOSPHATE 10 MG/ML IJ SOLN
INTRAMUSCULAR | Status: AC
  Filled 2021-03-21: qty 1

## 2021-03-21 MED ORDER — SUGAMMADEX SODIUM 200 MG/2ML IV SOLN
INTRAVENOUS | Status: DC | PRN
Start: 2021-03-21 — End: 2021-03-21
  Administered 2021-03-21: 200 mg via INTRAVENOUS

## 2021-03-21 MED ORDER — PHENYLEPHRINE HCL-NACL 20-0.9 MG/250ML-% IV SOLN
INTRAVENOUS | Status: DC | PRN
  Administered 2021-03-21: 50 ug/min via INTRAVENOUS

## 2021-03-21 MED ORDER — ROCURONIUM BROMIDE 10 MG/ML (PF) SYRINGE
PREFILLED_SYRINGE | INTRAVENOUS | Status: AC
  Filled 2021-03-21: qty 10

## 2021-03-21 MED ORDER — PHENYLEPHRINE 40 MCG/ML (10ML) SYRINGE FOR IV PUSH (FOR BLOOD PRESSURE SUPPORT)
PREFILLED_SYRINGE | INTRAVENOUS | Status: AC
  Filled 2021-03-21: qty 10

## 2021-03-21 MED ORDER — ONDANSETRON HCL 4 MG/2ML IJ SOLN: 4.0000 mg | Freq: Once | INTRAMUSCULAR | Status: DC | PRN

## 2021-03-21 MED ORDER — CEFAZOLIN SODIUM-DEXTROSE 2-4 GM/100ML-% IV SOLN
INTRAVENOUS | Status: AC
  Filled 2021-03-21: qty 100

## 2021-03-21 MED ORDER — PHENYLEPHRINE HCL (PRESSORS) 10 MG/ML IV SOLN
INTRAVENOUS | Status: AC
  Filled 2021-03-21: qty 2

## 2021-03-21 MED ORDER — FENTANYL CITRATE (PF) 250 MCG/5ML IJ SOLN
INTRAMUSCULAR | Status: AC
  Filled 2021-03-21: qty 5

## 2021-03-21 MED ORDER — BUPIVACAINE LIPOSOME 1.3 % IJ SUSP
INTRAMUSCULAR | Status: AC
  Filled 2021-03-21: qty 20

## 2021-03-21 MED ORDER — ATORVASTATIN CALCIUM 40 MG PO TABS
40.0000 mg | ORAL_TABLET | Freq: Every day | ORAL | Status: DC
  Administered 2021-03-22 – 2021-03-23 (×2): 40 mg via ORAL
  Filled 2021-03-21 (×2): qty 1

## 2021-03-21 MED ORDER — LACTATED RINGERS IV SOLN: INTRAVENOUS | Status: DC | PRN

## 2021-03-21 MED ORDER — ALBUMIN HUMAN 5 % IV SOLN
INTRAVENOUS | Status: AC
  Filled 2021-03-21: qty 500

## 2021-03-21 MED ORDER — MORPHINE SULFATE 1 MG/ML IV SOLN PCA
INTRAVENOUS | Status: DC
  Administered 2021-03-21 – 2021-03-22 (×2): 6 mg via INTRAVENOUS
  Administered 2021-03-22: 5 mg via INTRAVENOUS
  Filled 2021-03-21: qty 30

## 2021-03-21 MED ORDER — OXYCODONE HCL 5 MG PO TABS: 5.0000 mg | ORAL_TABLET | Freq: Once | ORAL | Status: DC | PRN

## 2021-03-21 MED ORDER — KETAMINE HCL 10 MG/ML IJ SOLN
INTRAMUSCULAR | Status: AC
  Filled 2021-03-21: qty 1

## 2021-03-21 MED ORDER — HYDROMORPHONE HCL 1 MG/ML IJ SOLN
0.2500 mg | INTRAMUSCULAR | Status: DC | PRN
  Administered 2021-03-21 (×2): 0.5 mg via INTRAVENOUS

## 2021-03-21 MED ORDER — ACETAMINOPHEN 10 MG/ML IV SOLN
INTRAVENOUS | Status: AC
  Filled 2021-03-21: qty 100

## 2021-03-21 MED ORDER — MIDAZOLAM HCL 2 MG/2ML IJ SOLN
INTRAMUSCULAR | Status: DC | PRN
  Administered 2021-03-21: 2 mg via INTRAVENOUS

## 2021-03-21 MED ORDER — TAMSULOSIN HCL 0.4 MG PO CAPS
0.8000 mg | ORAL_CAPSULE | Freq: Every day | ORAL | Status: DC
  Administered 2021-03-21 – 2021-03-22 (×2): 0.8 mg via ORAL
  Filled 2021-03-21: qty 2

## 2021-03-21 MED ORDER — SUCCINYLCHOLINE CHLORIDE 200 MG/10ML IV SOSY
PREFILLED_SYRINGE | INTRAVENOUS | Status: DC | PRN
  Administered 2021-03-21: 90 mg via INTRAVENOUS

## 2021-03-21 MED ORDER — EPHEDRINE 5 MG/ML INJ
INTRAVENOUS | Status: AC
  Filled 2021-03-21: qty 5

## 2021-03-21 MED ORDER — OXYCODONE HCL 5 MG PO TABS
ORAL_TABLET | ORAL | Status: AC
  Filled 2021-03-21: qty 1

## 2021-03-21 MED ORDER — LACTATED RINGERS IV SOLN: INTRAVENOUS | Status: DC

## 2021-03-21 MED ORDER — SUCCINYLCHOLINE CHLORIDE 200 MG/10ML IV SOSY
PREFILLED_SYRINGE | INTRAVENOUS | Status: AC
  Filled 2021-03-21: qty 10

## 2021-03-21 MED ORDER — ROCURONIUM BROMIDE 10 MG/ML (PF) SYRINGE
PREFILLED_SYRINGE | INTRAVENOUS | Status: DC | PRN
  Administered 2021-03-21: 30 mg via INTRAVENOUS
  Administered 2021-03-21 (×4): 20 mg via INTRAVENOUS
  Administered 2021-03-21: 30 mg via INTRAVENOUS
  Administered 2021-03-21: 70 mg via INTRAVENOUS

## 2021-03-21 MED ORDER — ONDANSETRON HCL 4 MG/2ML IJ SOLN
4.0000 mg | INTRAMUSCULAR | Status: DC | PRN
  Administered 2021-03-22: 4 mg via INTRAVENOUS
  Filled 2021-03-21: qty 2

## 2021-03-21 MED ORDER — PROPOFOL 10 MG/ML IV BOLUS
INTRAVENOUS | Status: DC | PRN
  Administered 2021-03-21: 120 mg via INTRAVENOUS

## 2021-03-21 MED ORDER — DEXAMETHASONE SODIUM PHOSPHATE 10 MG/ML IJ SOLN
INTRAMUSCULAR | Status: DC | PRN
  Administered 2021-03-21: 10 mg via INTRAVENOUS

## 2021-03-21 MED ORDER — SODIUM CHLORIDE 0.9% FLUSH: 9.0000 mL | INTRAVENOUS | Status: DC | PRN

## 2021-03-21 MED ORDER — PROPOFOL 10 MG/ML IV BOLUS
INTRAVENOUS | Status: AC
  Filled 2021-03-21: qty 20

## 2021-03-21 MED ORDER — ACETAMINOPHEN 10 MG/ML IV SOLN
1000.0000 mg | Freq: Once | INTRAVENOUS | Status: DC | PRN
  Administered 2021-03-21: 15:00:00 1000 mg via INTRAVENOUS

## 2021-03-21 MED ORDER — SODIUM CHLORIDE 0.45 % IV SOLN: INTRAVENOUS | Status: DC

## 2021-03-21 MED ORDER — LIDOCAINE HCL (CARDIAC) PF 100 MG/5ML IV SOSY
PREFILLED_SYRINGE | INTRAVENOUS | Status: DC | PRN
  Administered 2021-03-21: 100 mg via INTRATRACHEAL

## 2021-03-21 MED ORDER — CHLORHEXIDINE GLUCONATE CLOTH 2 % EX PADS
6.0000 | MEDICATED_PAD | Freq: Every day | CUTANEOUS | Status: DC
  Administered 2021-03-22 – 2021-03-23 (×2): 6 via TOPICAL

## 2021-03-21 MED ORDER — NALOXONE HCL 0.4 MG/ML IJ SOLN: 0.4000 mg | INTRAMUSCULAR | Status: DC | PRN

## 2021-03-21 MED ORDER — CEFAZOLIN SODIUM-DEXTROSE 2-4 GM/100ML-% IV SOLN
2.0000 g | Freq: Once | INTRAVENOUS | Status: AC
  Administered 2021-03-21 (×2): 2 g via INTRAVENOUS
  Filled 2021-03-21: qty 100

## 2021-03-21 MED ORDER — DIPHENHYDRAMINE HCL 50 MG/ML IJ SOLN: 12.5000 mg | Freq: Four times a day (QID) | INTRAMUSCULAR | Status: DC | PRN

## 2021-03-21 MED ORDER — ONDANSETRON HCL 4 MG/2ML IJ SOLN
INTRAMUSCULAR | Status: DC | PRN
  Administered 2021-03-21: 4 mg via INTRAVENOUS

## 2021-03-21 SURGICAL SUPPLY — 88 items
ADH SKN CLS APL DERMABOND .7 (GAUZE/BANDAGES/DRESSINGS) ×2
APL PRP STRL LF DISP 70% ISPRP (MISCELLANEOUS) ×1
BAG COUNTER SPONGE SURGICOUNT (BAG) ×2 IMPLANT
BAG LAPAROSCOPIC 12 15 PORT 16 (BASKET) ×1 IMPLANT
BAG RETRIEVAL 12/15 (BASKET) ×2
BAG SPNG CNTER NS LX DISP (BAG) ×1
BLADE EXTENDED COATED 6.5IN (ELECTRODE) ×1 IMPLANT
CHLORAPREP W/TINT 26 (MISCELLANEOUS) ×2 IMPLANT
CLIP LIGATING HEM O LOK PURPLE (MISCELLANEOUS) ×1 IMPLANT
CLIP LIGATING HEMO LOK XL GOLD (MISCELLANEOUS) ×2 IMPLANT
CLIP LIGATING HEMO O LOK GREEN (MISCELLANEOUS) ×3 IMPLANT
COVER SURGICAL LIGHT HANDLE (MISCELLANEOUS) ×2 IMPLANT
COVER TIP SHEARS 8 DVNC (MISCELLANEOUS) ×1 IMPLANT
COVER TIP SHEARS 8MM DA VINCI (MISCELLANEOUS) ×2
CUTTER ECHEON FLEX ENDO 45 340 (ENDOMECHANICALS) ×1 IMPLANT
DECANTER SPIKE VIAL GLASS SM (MISCELLANEOUS) ×2 IMPLANT
DERMABOND ADVANCED (GAUZE/BANDAGES/DRESSINGS) ×2
DERMABOND ADVANCED .7 DNX12 (GAUZE/BANDAGES/DRESSINGS) ×2 IMPLANT
DRAIN CHANNEL 15F RND FF 3/16 (WOUND CARE) IMPLANT
DRAPE ARM DVNC X/XI (DISPOSABLE) ×4 IMPLANT
DRAPE COLUMN DVNC XI (DISPOSABLE) ×1 IMPLANT
DRAPE DA VINCI XI ARM (DISPOSABLE) ×8
DRAPE DA VINCI XI COLUMN (DISPOSABLE) ×2
DRAPE INCISE IOBAN 66X45 STRL (DRAPES) ×3 IMPLANT
DRAPE LAPAROSCOPIC ABDOMINAL (DRAPES) ×1 IMPLANT
DRAPE SHEET LG 3/4 BI-LAMINATE (DRAPES) ×2 IMPLANT
DRAPE WARM FLUID 44X44 (DRAPES) ×1 IMPLANT
DRSG TELFA 3X8 NADH (GAUZE/BANDAGES/DRESSINGS) ×2 IMPLANT
ELECT PENCIL ROCKER SW 15FT (MISCELLANEOUS) ×3 IMPLANT
ELECT REM PT RETURN 15FT ADLT (MISCELLANEOUS) ×2 IMPLANT
EVACUATOR SILICONE 100CC (DRAIN) IMPLANT
GAUZE SPONGE 4X4 12PLY STRL (GAUZE/BANDAGES/DRESSINGS) ×1 IMPLANT
GLOVE SURG ENC MOIS LTX SZ6.5 (GLOVE) ×2 IMPLANT
GLOVE SURG ENC TEXT LTX SZ7 (GLOVE) ×2 IMPLANT
GLOVE SURG ENC TEXT LTX SZ7.5 (GLOVE) ×4 IMPLANT
GLOVE SURG UNDER POLY LF SZ7 (GLOVE) ×2 IMPLANT
GOWN STRL REUS W/TWL LRG LVL3 (GOWN DISPOSABLE) ×6 IMPLANT
HOLDER FOLEY CATH W/STRAP (MISCELLANEOUS) ×2 IMPLANT
IRRIG SUCT STRYKERFLOW 2 WTIP (MISCELLANEOUS) ×2
IRRIGATION SUCT STRKRFLW 2 WTP (MISCELLANEOUS) ×1 IMPLANT
KIT BASIN OR (CUSTOM PROCEDURE TRAY) ×2 IMPLANT
KIT TURNOVER KIT A (KITS) IMPLANT
LIGASURE IMPACT 36 18CM CVD LR (INSTRUMENTS) ×1 IMPLANT
LOOP VESSEL MAXI BLUE (MISCELLANEOUS) ×1 IMPLANT
MARKER SKIN DUAL TIP RULER LAB (MISCELLANEOUS) ×2 IMPLANT
NDL INSUFFLATION 14GA 120MM (NEEDLE) ×1 IMPLANT
NEEDLE INSUFFLATION 14GA 120MM (NEEDLE) ×2 IMPLANT
PAD DRESSING TELFA 3X8 NADH (GAUZE/BANDAGES/DRESSINGS) IMPLANT
PATTIES SURGICAL .5 X3 (DISPOSABLE) IMPLANT
PROTECTOR NERVE ULNAR (MISCELLANEOUS) ×4 IMPLANT
RELOAD STAPLE 45 2.6 WHT THIN (STAPLE) IMPLANT
SEAL CANN UNIV 5-8 DVNC XI (MISCELLANEOUS) ×4 IMPLANT
SEAL XI 5MM-8MM UNIVERSAL (MISCELLANEOUS) ×8
SEALER VESSEL DA VINCI XI (MISCELLANEOUS)
SEALER VESSEL EXT DVNC XI (MISCELLANEOUS) IMPLANT
SET TUBE SMOKE EVAC HIGH FLOW (TUBING) ×2 IMPLANT
SOLUTION ELECTROLUBE (MISCELLANEOUS) ×2 IMPLANT
SPONGE T-LAP 18X18 ~~LOC~~+RFID (SPONGE) ×2 IMPLANT
SPONGE T-LAP 4X18 ~~LOC~~+RFID (SPONGE) IMPLANT
STAPLE RELOAD 45 WHT (STAPLE) ×3 IMPLANT
STAPLE RELOAD 45MM WHITE (STAPLE) ×6
STAPLER VISISTAT 35W (STAPLE) ×1 IMPLANT
SUT ETHILON 3 0 PS 1 (SUTURE) IMPLANT
SUT MNCRL AB 4-0 PS2 18 (SUTURE) ×4 IMPLANT
SUT PDS AB 1 CT1 27 (SUTURE) ×4 IMPLANT
SUT PDS AB 1 TP1 96 (SUTURE) ×2 IMPLANT
SUT SILK 0 (SUTURE) ×2
SUT SILK 0 30XBRD TIE 6 (SUTURE) IMPLANT
SUT SILK 2 0 (SUTURE) ×4
SUT SILK 2-0 30XBRD TIE 12 (SUTURE) IMPLANT
SUT VIC AB 0 CT1 27 (SUTURE) ×2
SUT VIC AB 0 CT1 27XBRD ANTBC (SUTURE) IMPLANT
SUT VIC AB 2-0 SH 27 (SUTURE) ×2
SUT VIC AB 2-0 SH 27X BRD (SUTURE) IMPLANT
SUT VIC AB 3-0 SH 27 (SUTURE) ×4
SUT VIC AB 3-0 SH 27XBRD (SUTURE) IMPLANT
SUT VICRYL 0 UR6 27IN ABS (SUTURE) IMPLANT
SYR BULB IRRIG 60ML STRL (SYRINGE) ×1 IMPLANT
TAPE CLOTH SURG 4X10 WHT LF (GAUZE/BANDAGES/DRESSINGS) ×1 IMPLANT
TOWEL OR 17X26 10 PK STRL BLUE (TOWEL DISPOSABLE) ×2 IMPLANT
TOWEL OR NON WOVEN STRL DISP B (DISPOSABLE) ×2 IMPLANT
TRAY FOLEY MTR SLVR 16FR STAT (SET/KITS/TRAYS/PACK) ×2 IMPLANT
TRAY LAPAROSCOPIC (CUSTOM PROCEDURE TRAY) ×2 IMPLANT
TROCAR BLADELESS OPT 5 100 (ENDOMECHANICALS) ×1 IMPLANT
TROCAR UNIVERSAL OPT 12M 100M (ENDOMECHANICALS) ×2 IMPLANT
TROCAR XCEL 12X100 BLDLESS (ENDOMECHANICALS) ×2 IMPLANT
WATER STERILE IRR 1000ML POUR (IV SOLUTION) ×2 IMPLANT
YANKAUER SUCT BULB TIP 10FT TU (MISCELLANEOUS) ×1 IMPLANT

## 2021-03-21 NOTE — Transfer of Care (Signed)
Immediate Anesthesia Transfer of Care Note  Patient: Edwin Levine  Procedure(s) Performed: ATTEMPTED XI ROBOTIC ASSISTED LAPAROSCOPIC RADICAL NEPHRECTOMY, CONVERTED TO RIGHT OPEN NEPHRECTOMY (Right)  Patient Location: PACU  Anesthesia Type:General  Level of Consciousness: drowsy  Airway & Oxygen Therapy: Patient Spontanous Breathing and Patient connected to face mask oxygen  Post-op Assessment: Report given to RN and Post -op Vital signs reviewed and stable  Post vital signs: Reviewed and stable  Last Vitals:  Vitals Value Taken Time  BP 128/68 03/21/21 1430  Temp 36.4 C 03/21/21 1430  Pulse 80 03/21/21 1444  Resp 11 03/21/21 1444  SpO2 100 % 03/21/21 1444  Vitals shown include unvalidated device data.  Last Pain:  Vitals:   03/21/21 0644  TempSrc: Oral         Complications: No notable events documented.

## 2021-03-21 NOTE — Op Note (Signed)
Operative Note  Preoperative diagnosis:  1.  Right renal mass  Postoperative diagnosis: 1.  Right renal mass  Procedure(s): 1.  Robotic assisted laparoscopic converted to open right radical nephrectomy (adrenal sparing)  Surgeon: Rexene Alberts, MD  Assistants:  Dutch Gray, MD  An assistant was required for this surgical procedure.  The duties of the assistant included but were not limited to suctioning, passing suture, camera manipulation, retraction.  This procedure would not be able to be performed without an Environmental consultant.   Resident: Aldine Contes, PGY-4  Anesthesia:  General  Complications:  None  EBL:  331ml  Specimens: 1.  ID Type Source Tests Collected by Time Destination  1 : Right Kidney Tissue PATH GU resection / TURBT / partial nephrectomy SURGICAL PATHOLOGY Janith Lima, MD 03/21/2021 1217    Drains/Catheters: 1.  16 Fr foley catheter  Intraoperative findings:   15 cm right lower pole renal mass extending medially with desmoplastic reaction at duodenum and IVC. Required conversion to open in order to adequately dissect the mass free from the IVC.  2 arteries and 2 renal veins. Both renal arteries were secured with 2-0 silk sutures and both veins were secured with a battery operated stapler. Excellent hemostasis  Indication:  JUANMANUEL MAROHL is a 70 y.o. male with large right renal mass presenting for a robotic assisted laparoscopic right radical nephrectomy.  Description of procedure: The indications, alternatives, benefits and risks were discussed with the patient and informed consent was obtained.  The patient was brought onto the operating room table, positioned supine and secured to the bed with a safety strap.  All pressure points were carefully padded and pneumatic compression devices were placed on the lower extremities.  After the administration of intravenous antibiotics and general endotracheal anesthesia, a 16 French urethral catheter was inserted to drain  the bladder.  The patient was repositioned in the left lateral decubitus with the right side elevated at a 70 degree angle with the left lower leg flexed and the right upper leg extended.  An axillary roll was positioned to protect the brachial plexus and a gel pad was placed to support the back.  Multiple pillows were used to pad beneath and between the lower extremities and to ensure adequate cushioning. The right arm was placed in an armrest and carefully padded. The patient was secured in place across the hips, chest and legs with foam padding and silk tape, and the table was flexed.  The patient was prepped and draped in the standard sterile manner.  The radiographic images were in the room.  Timeout was completed verifying the correct patient, surgical procedure, site and positioning, prior to beginning the procedure.  I passed a Veress needle lateral to the rectus belly at the level of the umbilicus.  I attempted to insufflate however observed elevated pressures and was pre-peritoneal.  I then converted to an open access at the midline just superior to the umbilicus.  I dissected down to the fascia, cut the fascia sharply and then placed a 12 mm port and achieved adequate insufflation.  I surveyed the abdominal cavity noting no signs of injury, adhesions and no evidence of any metastatic disease.  I elected to place my ports medially given the large mass that was extending medially. The camera trocar was placed approximately 7-1/2 cm inferior and 2 cm medially to the planned position of the right robotic arm which was approximately 2 fingerbreadths inferior to the costal margin.  We then placed our right  robotic trocar, left robotic trocar and fourth arm trocar.  A 12 mm assistant port was placed periumbilically and a 5 mm assistant port was placed just inferior to the xiphoid process to use as a liver retractor.  The robot was then docked.  The white line of Toldt was incised and the colon was reflected  medially, exposing Gerota's fascia.  The hepatic flexure was mobilized.  This allowed for further retraction of the liver.  Of note, the mass was approximately 15 cm and extending medially.  After reflecting the colon, we came across the duodenum however there was a dense desmoplastic reaction with the duodenum on the mass.  Given the large lower pole mass, I was unable to achieve adequate lift on the kidney to complete this dissection robotically.  Thus I elected to convert to an open procedure.  The robot was undocked.  An Ioban dressing was placed over the patient.  The patient was then repositioned supine and reprepped and draped.  I made an incision from the xiphoid extending along the previously made incision superior to the umbilicus and around the umbilicus approximately 3 cm beneath the umbilicus.  We carried this incision through the subcutaneous tissues and into the fascia.  Exparel was placed along the fascia.  I then set up an Omni retractor and achieved adequate exposure.  We kocherized the duodenum with sharp dissection.  Of note this was tedious as the entire plane was desmoplastic and the duodenum was stuck on the mass. The gonadal vein and ureter were identified. We dissected lateral to the insertion of the gonadal vein at the vena cava and cephalad along to cava to find the renal vein. Carefully dissection was performed to dissect out a right lower pole renal artery.  We used 2 separate 2-0 silk sutures to ligate this artery and then this artery was cut.  At this point, I had difficulty further exposing the hilum above the right renal vein.  I did call Dr. Alinda Money to assist.   The ligasure was used to dissect the inferior and lateral pole.  This was also used to dissect the upper pole.  The upper pole dissection was difficult given how deep and proximal the upper pole of the kidney was as it was almost posterior to the IVC.  We were able to delineate a plane in between the right adrenal gland  and the kidney and we preserve the adrenal gland.  After freeing the entire kidney on its lateral, upper pole and posterior attachments, we then turned our attention towards the hilum.  We were able to optimize exposure with retraction and elevate the kidney to put the hilum on stretch.  The renal hilum was dissected using a right angle clamp. This revealed a branching renal vein and single renal artery that was posterior to the renal vein.  The right main renal vein was taken with 2-0 silk ties.  We had 2 ties proximally 1 time distally.  We cut between with excellent hemostasis.  The kidney softened.  The main renal vein appeared flat with no evidence of thrombus. The vein was then taken with a vascular load endoscopic stapler.  We did use an additional load to free up some more medial attachments posterior to the hilar dissection.  We previously had taken the ureter using 2 separate Weck clips and cutting between.  At this point, the kidney was freed and was removed and passed off for specimen.  The nephrectomy bed was inspected.  There is  no evidence of any bleeding or injury.  The staple lines were intact.  The 2 right renal arteries that were previously secured with ties had no evidence of any bleeding.    Copious irrigation was performed, a total of 3 liters of sterile water was used. Hemostasis was intact. Each retractor was removed and individually inspected. Hemostasis was intact, bowel was without injury. All lap pads were removed.    The fascia was closed with a running #1 PDS suture.  3-0 Vicryl interrupted sutures were used to reapproximate the subcutaneous tissue.  Staples were placed on the incision.  Telfa, 4 x 4's and tape were then placed.  His laparoscopic ports were closed with 4-0 Monocryl sutures.  Patient was awoken from anesthesia, transferred to PACU in stable condition.  [facility]  Pager: [phone]

## 2021-03-21 NOTE — Anesthesia Postprocedure Evaluation (Signed)
Anesthesia Post Note  Patient: Edwin Levine  Procedure(s) Performed: ATTEMPTED XI ROBOTIC ASSISTED LAPAROSCOPIC RADICAL NEPHRECTOMY, CONVERTED TO RIGHT OPEN NEPHRECTOMY (Right)     Patient location during evaluation: PACU Anesthesia Type: General Level of consciousness: awake and alert Pain management: pain level controlled Vital Signs Assessment: post-procedure vital signs reviewed and stable Respiratory status: spontaneous breathing, nonlabored ventilation, respiratory function stable and patient connected to nasal cannula oxygen Cardiovascular status: blood pressure returned to baseline and stable Postop Assessment: no apparent nausea or vomiting Anesthetic complications: no   No notable events documented.  Last Vitals:  Vitals:   03/21/21 1703 03/21/21 1800  BP:  (!) 132/55  Pulse:  70  Resp: 16 12  Temp:    SpO2: 95% 97%    Last Pain:  Vitals:   03/21/21 1703  TempSrc:   PainSc: 2                  [place]

## 2021-03-21 NOTE — H&P (Signed)
Office Visit Report     03/01/2021   --------------------------------------------------------------------------------   Edwin Levine  MRN: [id number]  DOB: Sep 07, 1950, 70 year old Male  EVO:[id number]   PRIMARY CARE:  Antony Contras, MD  REFERRING:  Janith Lima, MD  PROVIDER:  Rexene Alberts, M.D.  TREATING:  Edwin Levine, [place]  LOCATION:  [facility] [phone]     --------------------------------------------------------------------------------   CC/HPI: Pt presents today for pre-operative history and physical exam in anticipation of robotic assisted lap right radical nephrectomy (possible open) by Dr. Abner Greenspan on 03/21/21.   He received cardiac clearance to proceed with surgery on 02/02/21. Cards stated he may stop ASA surrounding surgery.   No issues with self cathing TID. Can urinate but does not empty.   He is doing well and is without complaint.   Pt denies F/C, HA, CP, SOB, N/V, diarrhea/constipation, back pain, flank pain, hematuria, and dysuria.     HX:   Edwin Levine is a 70 year old male seen in follow-up with a recently diagnosed large right renal mass, history of elevated PSA, BPH with LUTS, incomplete bladder emptying.   1. Newly diagnosed right renal mass: Given history of gross hematuria as below, CT hematuria protocol 01/16/2021 revealed a 14.1 x 13.6 x 13.1 cm large heterogeneously enhancing exophytic mass arising from the lower pole of the right kidney consistent with renal cell carcinoma. There is no evidence of renal vein invasion, lymphadenopathy or metastatic disease. He did have some mild right hydronephrosis due to mass-effect. He denies abdominal pain or flank pain. He denies family history of urologic malignancy.   2. Elevated PSA: He has had a history of an elevated PSA and his first TRUS biopsy on 08/08/2011 which revealed a 71 cc prostate with pathology revealing high-grade PIN but no other evidence of cancer or dysplasia. Repeat TRUS  biopsy on 06/03/2019 revealed prostate volume 108 g with pathology revealing BPH only with 1 core positive for HGPIN. Prebiopsy PSA was 10.5. His most recent PSA on 12/13/2020 was 10.5. This represents a decline from 11.5 in 05/2020. He denies bone pain or unexpected weight loss. He has stable appetite. We discussed that his elevated PSA is likely due to the large size of his gland. This time, he is not interested in prostate MRI or repeat TRUS biopsy.   3. BPH/LUTS: He does have some moderate lower urinary tract symptoms however reports that these have slightly increased over the past few weeks. His IPSS score is 24, Q OL 3. He does report a sensation of incomplete bladder emptying, frequency, intermittency, urgency, weak flow stream and 1 time nocturia. He undergoes CIC 2-3 times daily however states that recently, he has had to do this up to 5-6 times daily. He states this is effective in decreasing his nocturia episodes. He takes tamsulosin 0.8 mg daily. He is currently not interested in surgical intervention.   #4[place]ney [place] hematuria: He does report a few episodes of painless gross hematuria over the past couple of months. CT hematuria protocol 01/16/2021 with large right renal mass as above. Cystoscopy 01/26/2021 with bilobar obstructing prostate however no suspicious bladder lesions. He did have multiple bladder stones measuring up to 1.6 cm. He denies a smoking history. He denies a family history of urologic malignancy.   #5. Bladder stones: CT hematuria protocol 01/17/2019 with multiple bladder stones measuring up to 1.6 cm. This confirm a cystoscopy 01/26/2021.   Does have a history of a right testicular injury and underwent a right  orchiectomy as a child after being struck with a baseball in his right testicle.   He follows with cardiology, Dr. Lovena Le. He has a history of prior ablation for SVT. He denies taking anticoagulation. He has no prior abdominal surgeries. He will need cardiac clearance.      ALLERGIES: No Allergies No Known Drug Allergies    MEDICATIONS: Finasteride 5 mg tablet 1 tablet PO Daily  Tamsulosin Hcl 0.4 mg capsule TAKE 2 CAPSULES BY MOUTH AT BEDTIME  Tamsulosin Hcl 0.4 mg capsule 2 capsule PO Q HS  Lipitor 40 MG Oral Tablet Oral  Tamsulosin HCl - 0.4 MG Oral Capsule 1 Oral Daily  Tylenol     Notes: naproxen prn  lisinopril/HCTZ 20/25mg  QD    GU PSH: Cystoscopy - 01/26/2021 Locm 300-399Mg /Ml Iodine,1Ml - 01/14/2021 Prostate Needle Biopsy - 2021, 2013 Simple orchiectomy - 2013       [facility]ester Notes: Biopsy Of The Prostate Needle, Hernia Repair, Orchiectomy Right  right inguinal hernia repair  repair of undescended testicles at age 59    NON-GU PSH: Hernia Repair - 2013 Surgical Pathology, Gross And Microscopic Examination For Prostate Needle - 2021     GU PMH: BPH w/LUTS - 01/26/2021, - 12/30/2020, - 06/14/2020 (Stable), He has marked BPH by exam. I am going to increase his tamsulosin and when he returns if he continues to have an elevated PVR I would consider placing him on finasteride. I discussed that briefly with him today., - 2019 (Stable), His prostate remains enlarged on examination. He continues to perform intermittent self-catheterization, - 2017, Benign prostatic hypertrophy with weak urinary stream, - 2016 Elevated PSA - 01/26/2021, - 12/30/2020, - 06/14/2020, His biopsy was again negative so my recommendation is a recheck the PSA again in 6 months with DRE and PSA again in 12 months., - 2021, - 2021, Although his prostate is benign to exam his PSA has steadily risen and therefore I have recommended we perform a repeat biopsy. He takes a baby aspirin and will stop that., - 2021 (Stable), His PSA remains elevated at 8.2 for with no abnormality noted on DRE and this has been at this level with some variation for the past 2 years. I have recommended continued monitoring with a repeat PSA in 3 months and then again in 6 months., - 2020 (Stable), His PSA remains elevated  but stable. It is currently 7.73 with a free to total ratio of 23. I think his very large prostate is the source of his elevated PSA but will continue to follow this closely., - 2019 (Stable), I am going to plan to have him back in 3 months for a recheck of his PSA and then again 3 months after that for DRE, - 2019 (Stable), His prostate remains benign to exam and his PSA at 5.81 remains stable over time. He had a negative biopsy previously. I want to continue to monitor him every 6 months with a PSA in 6 months and then PSA and DRE again in 12 months., - 2018 (Stable), The prostate remains benign to examination. In addition his PSA at 5.61 remains stable and is actually lower than it has been over the past year. I will continue to monitor him with DRE and PSA every 6 months., - 2017, - 2017, Elevated prostate specific antigen (PSA), - 2017 Incomplete bladder emptying - 01/26/2021, - 12/30/2020 (Stable), - 06/14/2020 (Stable), He does require CIC before going to bed and this allows him to sleep throughout the night. I am going  to see if the increased dosage of tamsulosin helps empty his bladder better. I will increase his dosage to 0.8 mg HS., - 2019 Right renal neoplasm - 01/26/2021 Urinary Frequency - 01/26/2021, - 12/30/2020, - 06/14/2020 Weak Urinary Stream - 01/26/2021, - 06/14/2020 Gross hematuria - 01/14/2021, - 12/30/2020 Chronic cystitis (w/o hematuria) (Stable), Although he did have some pyuria and bacteriuria today he is not having any symptoms. We did discuss continuing his Bactrim at night but he is going to reduce the dosage to 1/2 pill. - 2020, (Stable), He has a history of persistent Klebsiella infection and is having some voiding symptoms so I am going to have him complete a course of antibiotics and then place him on low-dose suppression. My hope is that this will help with his voiding symptoms and better emptying of his bladder., - 2019 BPH w/o LUTS (Stable), He has a markedly enlarged prostate but has  no voiding symptoms. In fact he has been able to stop his Flomax and noticed no difference in his voiding pattern. - 2018 Acute Cystitis/UTI (Acute), He appears to have an acute cystitis today. I will culture his urine and treat accordingly sensitivities. - 2017 Urinary Retention, drug induced - 2017 Urinary Retention, Acute urinary retention - 2016 CIS of prostate/prostatic urethra, PIN III (prostatic intraepithelial neoplasia III) - 2015 Primary hypogonadism, Hypogonadism, testicular - 2015 ED due to arterial insufficiency, Erectile dysfunction due to arterial insufficiency - 2014      PMH Notes: Hypogonadism: The patient had a right orchiectomy in the 1960s and was found in 8/12 to have a free testosterone of 5.5 (7.2-24). He was started on AndroGel at 2 pumps q. day and a repeat testosterone in 1/13 was noted to be 467/6.1. His AndroGel was increased to 3 pumps q. day. He had been experiencing mild erectile dysfunction that had responded to phosphodiesterase inhibitors initially. Once he started testosterone replacement he found he did not need further oral medication for his erectile dysfunction. He stopped taking the medication in 6/14 due to the multiple ads seen on TV by lawyers. He did not want to pursue further treatment.  Current treatment: None   Urinary retention: He developed urinary retention on 02/03/15 and was seen in the ER. He was found to have 900 cc in his bladder when a catheter was placed and his urine was nitrite positive but had only 0-2 white blood cells.  He was placed on an alpha-blocker and has subsequently failed multiple voiding trials. I had him evaluated by physical therapy to see if this would result in improvement in his ability to urinate.  When I saw him in 11/16 he was still dealing with urinary retention and had been to physical therapy but failed to return due to lack of results. He had been taught intermittent self-catheterization and I recommended urodynamics  but he did not follow-up for that study.  He performs self-catheterization about once every 2-3 weeks.   NON-GU PMH: Pyuria/other UA findings (Stable), He has a history of infections and had a lot a white cells in his urine today but only rare bacteria. In preparation for his biopsy I will culture his urine. - 2021, I noted white blood cells and bacteria in his urine that was nitrite positive. I will culture, - 2019 Bacteriuria, He has bacteriuria and pyuria today and he has no voiding symptoms whatsoever. This would be considered asymptomatic bacteriuria and not an acute cystitis/UTI. As such I have not recommended treatment with antibiotics as continued treatment of  this condition with antibiotics will inevitably result in progressively resistant bacteria. Therefore I will obtain a culture today to use to guide therapy should he become symptomatic in the future but will not prescribe antibiotics and would not recommend she receive antibiotics in the future unless he is symptomatic. - 2017 Other lack of coordination, Muscular incoordination - 2016 Other muscle spasm, Muscle spasm - 2016 Encounter for general adult medical examination without abnormal findings, Encounter for preventive health examination - 2015 Personal history of other diseases of the circulatory system, History of hypertension - 2014 Personal history of other endocrine, nutritional and metabolic disease, History of hypercholesterolemia - 2014    FAMILY HISTORY: Congestive Heart Failure - Runs In Family Death of family member - Mother, Father [place] is 1 daughter and 1 - Runs In [place] _4__ Living Daughter - Runs In [place] In Family Lung Cancer - Mother, Runs In Family No pertinent family history - Other   SOCIAL HISTORY: Marital Status: Married Preferred Language: English; Ethnicity: Not Hispanic Or Latino; Race: White Current Smoking Status: Patient  does not smoke anymore.  Does not use smokeless tobacco. Has never drank.  Does not use drugs. Drinks 1 caffeinated drink per day. Has not had a blood transfusion.     Notes: Quit smoking 60 years ago    REVIEW OF SYSTEMS:    GU Review Male:   Patient reports trouble starting your stream. Patient denies frequent urination, hard to postpone urination, burning/ pain with urination, get up at night to urinate, leakage of urine, stream starts and stops, have to strain to urinate , erection problems, and penile pain.  Gastrointestinal (Upper):   Patient denies nausea and vomiting.  Gastrointestinal (Lower):   Patient denies diarrhea and constipation.  Constitutional:   Patient denies fever, night sweats, weight loss, and fatigue.  Skin:   Patient denies skin rash/ lesion and itching.  Eyes:   Patient denies blurred vision and double vision.  Ears/ Nose/ Throat:   Patient denies sore throat and sinus problems.  Hematologic/Lymphatic:   Patient denies swollen glands and easy bruising.  Cardiovascular:   Patient denies leg swelling and chest pains.  Respiratory:   Patient denies cough and shortness of breath.  Endocrine:   Patient denies excessive thirst.  Musculoskeletal:   Patient denies back pain and joint pain.  Neurological:   Patient denies headaches and dizziness.  Psychologic:   Patient denies depression and anxiety.   VITAL SIGNS:      03/01/2021 03:29 PM  Weight 198 lb / 89.81 kg  Height 69 in / 175.26 cm  BP 144/74 mmHg  Pulse 75 /min  BMI 29.2 kg/m   MULTI-SYSTEM PHYSICAL EXAMINATION:    Constitutional: Well-nourished. No physical deformities. Normally developed. Good grooming.  Neck: Neck symmetrical, not swollen. Normal tracheal position.  Respiratory: Normal breath sounds. No labored breathing, no use of accessory muscles.   Cardiovascular: Regular rate and rhythm. No murmur, no gallop.   Lymphatic: No enlargement of neck, axillae, groin.  Skin: No paleness, no  jaundice, no cyanosis. No lesion, no ulcer, no rash.  Neurologic / Psychiatric: Oriented to time, oriented to place, oriented to person. No depression, no anxiety, no agitation.  Gastrointestinal: large right sided mass, no tenderness, no rigidity, mod obese abdomen.   Eyes: Normal conjunctivae. Normal eyelids.  Ears, Nose, Mouth, and Throat: Left ear no scars, no lesions, no masses. Right ear no scars, no lesions, no  masses. Nose no scars, no lesions, no masses. Normal hearing. Normal lips.  Musculoskeletal: Normal gait and station of head and neck.     Complexity of Data:  Records Review:   Previous Patient Records  Urine Test Review:   Urinalysis   03/01/21  Urinalysis  Urine Appearance Cloudy   Urine Color Yellow   Urine Glucose Neg mg/dL  Urine Bilirubin Neg mg/dL  Urine Ketones Neg mg/dL  Urine Specific Gravity 1.020   Urine Blood Neg ery/uL  Urine pH 6.0   Urine Protein Neg mg/dL  Urine Urobilinogen 0.2 mg/dL  Urine Nitrites Neg   Urine Leukocyte Esterase 2+ leu/uL  Urine WBC/hpf 10 - 20/hpf   Urine RBC/hpf 0 - 2/hpf   Urine Epithelial Cells 0 - 5/hpf   Urine Bacteria NS (Not Seen)   Urine Mucous Not Present   Urine Yeast NS (Not Seen)   Urine Trichomonas Not Present   Urine Cystals NS (Not Seen)   Urine Casts NS (Not Seen)   Urine Sperm Not Present    PROCEDURES:          Urinalysis w/Scope - 81001 Dipstick Dipstick Cont'd Micro  Color: Yellow Bilirubin: Neg mg/dL WBC/hpf: 10 - 20/hpf  Appearance: Cloudy Ketones: Neg mg/dL RBC/hpf: 0 - 2/hpf  Specific Gravity: 1.020 Blood: Neg ery/uL Bacteria: NS (Not Seen)  pH: 6.0 Protein: Neg mg/dL Cystals: NS (Not Seen)  Glucose: Neg mg/dL Urobilinogen: 0.2 mg/dL Casts: NS (Not Seen)    Nitrites: Neg Trichomonas: Not Present    Leukocyte Esterase: 2+ leu/uL Mucous: Not Present      Epithelial Cells: 0 - 5/hpf      Yeast: NS (Not Seen)      Sperm: Not Present    ASSESSMENT:      ICD-10 Details  1 GU:   Right renal  neoplasm - D49.511   2   Gross hematuria - R31.0    PLAN:            Medications Stop Meds: Metoprolol Tartrate 25 mg tablet  Discontinue: 03/01/2021  - Reason: The medication cycle was completed.  Aspirin 81 MG TABS Oral  Start: 07/17/2011  Discontinue: 03/01/2021  - Reason: The medication cycle was completed.  Calcium TABS Oral  Start: 07/17/2011  Discontinue: 03/01/2021  - Reason: The medication cycle was completed.  DilTIAZem HCl ER Coated Beads 120 MG Oral Capsule Extended Release 24 Hour Oral  Start: 08/16/2012  Discontinue: 03/01/2021  - Reason: The medication cycle was completed.  Fish Oil 1000 MG Oral Capsule Oral  Start: 07/17/2011  Discontinue: 03/01/2021  - Reason: The medication cycle was completed.  Force Factor  Discontinue: 03/01/2021  - Reason: The medication cycle was completed.  Vitamin D TABS Oral  Start: 07/17/2011  Discontinue: 03/01/2021  - Reason: The medication cycle was completed.            Orders Labs Urine Culture          Schedule Return Visit/Planned Activity: Keep Scheduled Appointment - Schedule Surgery          Document Letter(s):  Created for Patient: Clinical Summary         Notes:   There are no changes in the patients history or physical exam since last evaluation by Dr. Abner Greenspan. Pt is scheduled to undergo RAL vs open right radical nephrectomy on 03/21/21.   UA with WBCs and pt self caths. Will check culture to r/o infection prior to surgery.   All pt's questions were answered  to the best of my ability.          Next Appointment:      Next Appointment: 03/21/2021 07:15 AM    Appointment Type: Surgery     Location: [facility] [phone]    Provider: Rexene Alberts, M.D.    Reason for Visit: WL/IP RT RA LAP RAD NEPHRECTOMY, POSSIBLE RT OPEN NEPHRECTOMY WITH RESIDENT    Urology Preoperative H&P   Chief Complaint: Right renal mass  History of Present Illness: Edwin Levine is a 70 y.o. male with right renal mass  her for right robotic assisted laparoscopic possible open radical nephrectomy. Denies fevers, chills.    Past Medical History:  Diagnosis Date   Cancer ([place])    right kidney   GERD (gastroesophageal reflux disease)    Hyperlipidemia    Hypertension    Palpitations    Pre-diabetes     Past Surgical History:  Procedure Laterality Date   ELECTROPHYSIOLOGIC STUDY N/A 02/16/2016   Procedure: SVT Ablation;  Surgeon: Evans Lance, MD;  Location: [facility];  Service: Cardiovascular;  Laterality: N/A;   HERNIA REPAIR      Allergies: No Known Allergies  Family History  Problem Relation Age of Onset   Heart Problems Mother    Aneurysm Father    Diabetes Neg Hx    Cancer Neg Hx    Stroke Neg Hx     Social History:  reports that he has never smoked. He has never used smokeless tobacco. He reports that he does not drink alcohol and does not use drugs.  ROS: A complete review of systems was performed.  All systems are negative except for pertinent findings as noted.  Physical Exam:  Vital signs in last 24 hours:   Constitutional:  Alert and oriented, No acute distress Cardiovascular: Regular rate and rhythm Respiratory: Normal respiratory effort, Lungs clear bilaterally GI: Abdomen is soft, nontender, nondistended, no abdominal masses GU: No CVA tenderness Lymphatic: No lymphadenopathy Neurologic: Grossly intact, no focal deficits Psychiatric: Normal mood and affect  Laboratory Data:  No results for input(s): WBC, HGB, HCT, PLT in the last 72 hours.  No results for input(s): NA, K, CL, GLUCOSE, BUN, CALCIUM, CREATININE in the last 72 hours.  Invalid input(s): CO3   No results found for this or any previous visit (from the past 24 hour(s)). No results found for this or any previous visit (from the past 240 hour(s)).  Renal Function: No results for input(s): CREATININE in the last 168 hours. Estimated Creatinine Clearance: 88 mL/min (by C-G formula based on  SCr of 0.89 mg/dL).  Radiologic Imaging: No results found.  I independently reviewed the above imaging studies.  Assessment and Plan CONCEPCION KIRKPATRICK is a 70 y.o. male with right renal mass her for right robotic assisted laparoscopic possible open radical nephrectomy. Discussed risks and    We have discussed the risks of treatment in detail including but not limited to bleeding, infection, heart attack, stroke, death, venothromoboembolism, cancer recurrence, injury/damage to surrounding organs and structures, urine leak, the possibility of open surgical conversion for patients undergoing minimally invasive surgery, the risk of developing chronic kidney disease and its associated implications, and the potential risk of end stage renal disease possibly necessitating dialysis.   Matt R. Aleksa Collinsworth MD 03/21/2021, 6:34 AM  [facility] Pager: [phone]

## 2021-03-21 NOTE — Anesthesia Procedure Notes (Addendum)
Procedure Name: Intubation Date/Time: 03/21/2021 7:43 AM Performed by: Sharlette Dense, CRNA Pre-anesthesia Checklist: Patient identified, Emergency Drugs available, Suction available and Patient being monitored Patient Re-evaluated:Patient Re-evaluated prior to induction Oxygen Delivery Method: Circle system utilized Preoxygenation: Pre-oxygenation with 100% oxygen Induction Type: IV induction and Rapid sequence Laryngoscope Size: Miller and 3 Grade View: Grade I Tube type: Oral Tube size: 8.0 mm Number of attempts: 1 Airway Equipment and Method: Stylet Placement Confirmation: ETT inserted through vocal cords under direct vision, positive ETCO2 and breath sounds checked- equal and bilateral Secured at: 22 cm Tube secured with: Tape Dental Injury: Teeth and Oropharynx as per pre-operative assessment

## 2021-03-22 ENCOUNTER — Encounter (HOSPITAL_COMMUNITY): Payer: Self-pay | Admitting: Urology

## 2021-03-22 LAB — CBC
HCT: 32.9 % — ABNORMAL LOW (ref 39.0–52.0)
Hemoglobin: 10.7 g/dL — ABNORMAL LOW (ref 13.0–17.0)
MCH: 31.3 pg (ref 26.0–34.0)
MCHC: 32.5 g/dL (ref 30.0–36.0)
MCV: 96.2 fL (ref 80.0–100.0)
Platelets: 203 10*3/uL (ref 150–400)
RBC: 3.42 MIL/uL — ABNORMAL LOW (ref 4.22–5.81)
RDW: 12.4 % (ref 11.5–15.5)
WBC: 17.9 10*3/uL — ABNORMAL HIGH (ref 4.0–10.5)
nRBC: 0 % (ref 0.0–0.2)

## 2021-03-22 LAB — BASIC METABOLIC PANEL
Anion gap: 10 (ref 5–15)
BUN: 22 mg/dL (ref 8–23)
CO2: 24 mmol/L (ref 22–32)
Calcium: 8.8 mg/dL — ABNORMAL LOW (ref 8.9–10.3)
Chloride: 102 mmol/L (ref 98–111)
Creatinine, Ser: 1.41 mg/dL — ABNORMAL HIGH (ref 0.61–1.24)
GFR, Estimated: 54 mL/min — ABNORMAL LOW (ref >60)
Glucose, Bld: 148 mg/dL — ABNORMAL HIGH (ref 70–99)
Potassium: 3.9 mmol/L (ref 3.5–5.1)
Sodium: 136 mmol/L (ref 135–145)

## 2021-03-22 MED ORDER — OXYCODONE-ACETAMINOPHEN 5-325 MG PO TABS
ORAL_TABLET | ORAL | Status: AC
  Filled 2021-03-22: qty 1

## 2021-03-22 MED ORDER — HYDROMORPHONE HCL 1 MG/ML IJ SOLN: 1.0000 mg | INTRAMUSCULAR | Status: DC | PRN

## 2021-03-22 MED ORDER — OXYCODONE-ACETAMINOPHEN 5-325 MG PO TABS
1.0000 | ORAL_TABLET | ORAL | Status: DC | PRN
  Administered 2021-03-22 – 2021-03-23 (×5): 1 via ORAL
  Filled 2021-03-22: qty 1

## 2021-03-22 NOTE — Progress Notes (Signed)
Pyxis not working properly. Had to override for the percocet 5-325 MG 1 tablet. RN witnessed.

## 2021-03-22 NOTE — Progress Notes (Addendum)
1 Day Post-Op Subjective: Pain controlled. No nausea or emesis. No flatus or BM. Tolerating sips. Afebrile.  Objective: Vital signs in last 24 hours: Temp:  [97.5 F (36.4 C)-98 F (36.7 C)] 98 F (36.7 C) (11/29 0000) Pulse Rate:  [60-80] 72 (11/29 0600) Resp:  [7-19] 13 (11/29 0600) BP: (103-180)/(45-74) 180/53 (11/29 0600) SpO2:  [93 %-100 %] 93 % (11/29 0600) Weight:  [92.6 kg] 92.6 kg (11/28 1703)  Intake/Output from previous day: 11/28 0701 - 11/29 0700 In: 5746.1 [I.V.:4814.7; IV Piggyback:931.4] Out: 3546 [Urine:1185; Blood:450] Intake/Output this shift: No intake/output data recorded. UOP: 1.1L clear yellow in foley  Physical Exam:  General: Alert and oriented CV: RRR Lungs: Clear Abdomen: Soft, ND, ATTP; inc c/d/I Ext: NT, No erythema  Lab Results: Recent Labs    03/21/21 1530 03/22/21 0230  HGB 12.0* 10.7*  HCT 36.6* 32.9*   BMET Recent Labs    03/21/21 1530 03/22/21 0230  NA 136 136  K 3.7 3.9  CL 102 102  CO2 22 24  GLUCOSE 189* 148*  BUN 18 22  CREATININE 1.26* 1.41*  CALCIUM 8.9 8.8*     Studies/Results: No results found.  Assessment/Plan: Large right renal mass s/p right open nephrectomy 03/21/2021 Hx HTN, pre-diabetes, HLD, GERD, s/p cardiac ablation  -Transition to PO pain control -Advance to clear liquids -Bowel regimen -Reactive leukocytosis -Hgb appropriate at 10.7 -Cr with expected bump at 1.41 -Void trial -OOB, amb, IS, SQH   LOS: 1 day   Matt R. Araf Clugston MD 03/22/2021, 7:03 AM [facility]  Pager: [phone]

## 2021-03-22 NOTE — Progress Notes (Signed)
Report called to RN on 4th floor, family at bedside during transfer, no distress noted.

## 2021-03-23 ENCOUNTER — Other Ambulatory Visit (HOSPITAL_COMMUNITY): Payer: Self-pay

## 2021-03-23 LAB — CBC
HCT: 33.7 % — ABNORMAL LOW (ref 39.0–52.0)
Hemoglobin: 11.4 g/dL — ABNORMAL LOW (ref 13.0–17.0)
MCH: 32 pg (ref 26.0–34.0)
MCHC: 33.8 g/dL (ref 30.0–36.0)
MCV: 94.7 fL (ref 80.0–100.0)
Platelets: 191 10*3/uL (ref 150–400)
RBC: 3.56 MIL/uL — ABNORMAL LOW (ref 4.22–5.81)
RDW: 12.4 % (ref 11.5–15.5)
WBC: 12.3 10*3/uL — ABNORMAL HIGH (ref 4.0–10.5)
nRBC: 0 % (ref 0.0–0.2)

## 2021-03-23 LAB — BASIC METABOLIC PANEL
Anion gap: 6 (ref 5–15)
BUN: 28 mg/dL — ABNORMAL HIGH (ref 8–23)
CO2: 27 mmol/L (ref 22–32)
Calcium: 9.5 mg/dL (ref 8.9–10.3)
Chloride: 102 mmol/L (ref 98–111)
Creatinine, Ser: 1.84 mg/dL — ABNORMAL HIGH (ref 0.61–1.24)
GFR, Estimated: 39 mL/min — ABNORMAL LOW (ref >60)
Glucose, Bld: 135 mg/dL — ABNORMAL HIGH (ref 70–99)
Potassium: 3.9 mmol/L (ref 3.5–5.1)
Sodium: 135 mmol/L (ref 135–145)

## 2021-03-23 MED ORDER — BISACODYL 10 MG RE SUPP
10.0000 mg | Freq: Once | RECTAL | Status: AC
  Administered 2021-03-23: 10 mg via RECTAL
  Filled 2021-03-23: qty 1

## 2021-03-23 MED ORDER — HEPARIN SODIUM (PORCINE) 5000 UNIT/ML IJ SOLN
5000.0000 [IU] | Freq: Three times a day (TID) | INTRAMUSCULAR | Status: DC
  Administered 2021-03-23 (×2): 5000 [IU] via SUBCUTANEOUS
  Filled 2021-03-23 (×2): qty 1

## 2021-03-23 MED ORDER — OXYCODONE-ACETAMINOPHEN 5-325 MG PO TABS
1.0000 | ORAL_TABLET | ORAL | 0 refills | Status: DC | PRN
Start: 2021-03-23 — End: 2021-11-21
  Filled 2021-03-23: qty 20, 3d supply, fill #0

## 2021-03-23 MED ORDER — DOCUSATE SODIUM 100 MG PO CAPS
100.0000 mg | ORAL_CAPSULE | Freq: Every day | ORAL | 0 refills | Status: DC | PRN
  Filled 2021-03-23: qty 30, 30d supply, fill #0

## 2021-03-23 NOTE — Progress Notes (Signed)
2 Days Post-Op Subjective: Pain controlled. Mild nausea overnight, no emesis. No flatus or BM. Ambulating well. Catheter removed and he is doing CIC per baseline for him.  Objective: Vital signs in last 24 hours: Temp:  [98.1 F (36.7 C)-98.7 F (37.1 C)] 98.7 F (37.1 C) (11/30 0425) Pulse Rate:  [64-75] 69 (11/30 0425) Resp:  [13-20] 20 (11/30 0425) BP: (105-139)/(48-65) 128/63 (11/30 0425) SpO2:  [91 %-99 %] 97 % (11/30 0425)  Intake/Output from previous day: 11/29 0701 - 11/30 0700 In: 1408 [P.O.:960; I.V.:382.9; IV Piggyback:65.1] Out: 600 [Urine:600] Intake/Output this shift: No intake/output data recorded.  Physical Exam:  General: Alert and oriented CV: RRR Lungs: Clear Abdomen: Soft, ND, ATTP; inc c/d/I Ext: NT, No erythema  Lab Results: Recent Labs    03/21/21 1530 03/22/21 0230  HGB 12.0* 10.7*  HCT 36.6* 32.9*   BMET Recent Labs    03/21/21 1530 03/22/21 0230  NA 136 136  K 3.7 3.9  CL 102 102  CO2 22 24  GLUCOSE 189* 148*  BUN 18 22  CREATININE 1.26* 1.41*  CALCIUM 8.9 8.8*     Studies/Results: No results found.  Assessment/Plan: 1. Large right renal mass s/p right open nephrectomy 03/21/2021 2. Hx HTN, pre-diabetes, HLD, GERD, s/p cardiac ablation   -PO pain control prn -Advance to regular diet -Bowel regimen -Labs pending this AM -OOB, amb, IS, SQH -Looks well, hopefully home this afternoon   LOS: 2 days   Matt R. Rubert Frediani MD 03/23/2021, 8:22 AM [facility]  Pager: [phone]

## 2021-03-23 NOTE — Discharge Summary (Addendum)
Date of admission: 03/21/2021  Date of discharge: 03/25/2021  Admission diagnosis: Right renal mass   Discharge diagnosis: Right renal mass   Secondary diagnoses:  Patient Active Problem List   Diagnosis Date Noted   Right renal mass 03/21/2021   Acute cystitis without hematuria    Benign prostatic hyperplasia with urinary frequency    Hypertension 02/14/2016   SVT (supraventricular tachycardia) (HCC) 02/14/2016   Syncope and collapse 02/14/2016   UTI (urinary tract infection) 02/14/2016   Hypokalemia 02/14/2016    Procedures performed: Procedure(s): ATTEMPTED XI ROBOTIC ASSISTED LAPAROSCOPIC RADICAL NEPHRECTOMY, CONVERTED TO RIGHT OPEN NEPHRECTOMY  History and Physical: For full details, please see admission history and physical. Briefly, Edwin Levine is a 70 y.o. year old patient with a very large 15cm right renal mass who presented for planned robotic versus open right radical nephrectomy.  He was found to be incidentally covid positive (asymptomatic) at preadmission testing but proceeding with surgery given concern for large renal cell carcinoma.  Hospital Course: Given extent of tumor and adherence to IVC and duodenum medially, decision was made to convert the procedure from robotic to open approach. Patient tolerated the procedure well.  He was then transferred to the floor after an uneventful PACU stay. He was initially in a step down bed given length of surgery and need to open.  His hospital course was uncomplicated.  On POD1 his foley was remove and he resumed his home CIC regimen. He tolerated a clear liquid diet without issues. On POD2 his diet was advanced to regular and he tolerated this without nausea or vomiting. On POD#2 he had met discharge criteria: was eating a regular diet, was up and ambulating independently,  pain was well controlled, was voiding without a catheter, passing flatus and was ready to for discharge.   Laboratory values:  Recent Labs     03/23/21 0925  WBC 12.3*  HGB 11.4*  HCT 33.7*   Recent Labs    03/23/21 0925  NA 135  K 3.9  CL 102  CO2 27  GLUCOSE 135*  BUN 28*  CREATININE 1.84*  CALCIUM 9.5   No results for input(s): LABPT, INR in the last 72 hours. No results for input(s): LABURIN in the last 72 hours. Results for orders placed or performed during the hospital encounter of 03/21/21  SARS Coronavirus 2 by RT PCR (hospital order, performed in [facility] hospital lab) Nasopharyngeal Nasopharyngeal Swab     Status: Abnormal   Collection Time: 03/21/21  5:28 AM   Specimen: Nasopharyngeal Swab  Result Value Ref Range Status   SARS Coronavirus 2 POSITIVE (A) NEGATIVE Final    Comment: AFTERHOURS (NOTE) SARS-CoV-2 target nucleic acids are DETECTED  SARS-CoV-2 RNA is generally detectable in upper respiratory specimens  during the acute phase of infection.  Positive results are indicative  of the presence of the identified virus, but do not rule out bacterial infection or co-infection with other pathogens not detected by the test.  Clinical correlation with patient history and  other diagnostic information is necessary to determine patient infection status.  The expected result is negative.  Fact Sheet for Patients:   StrictlyIdeas.no   Fact Sheet for Healthcare Providers:   BankingDealers.co.za    This test is not yet approved or cleared by the [place] FDA and  has been authorized for detection and/or diagnosis of SARS-CoV-2 by FDA under an Emergency Use Authorization (EUA).  This EUA will remain in effect (meaning this test can be used)  for the duration of  the COVID-19 declaration under Sectio n 564(b)(1) of the Act, 21 U.S.C. section 360-bbb-3(b)(1), unless the authorization is terminated or revoked sooner.  Performed at [facility], [place] [address] [place], [place] [address]   MRSA Next Gen by PCR, Nasal     Status: None    Collection Time: 03/21/21  4:22 PM   Specimen: Nasal Mucosa; Nasal Swab  Result Value Ref Range Status   MRSA by PCR Next Gen NOT DETECTED NOT DETECTED Final    Comment: (NOTE) The GeneXpert MRSA Assay (FDA approved for NASAL specimens only), is one component of a comprehensive MRSA colonization surveillance program. It is not intended to diagnose MRSA infection nor to guide or monitor treatment for MRSA infections. Test performance is not FDA approved in patients less than 68 years old. Performed at [facility], [place] [address], [place], [place] [address]     Disposition: Home  Discharge instruction: The patient was instructed to be ambulatory but told to refrain from heavy lifting, strenuous activity, or driving.   Discharge medications:  Allergies as of 03/23/2021   No Known Allergies      Medication List     TAKE these medications    acetaminophen 325 MG tablet Commonly known as: TYLENOL Take 650 mg by mouth every 6 (six) hours as needed for mild pain.   atorvastatin 40 MG tablet Commonly known as: LIPITOR Take 40 mg by mouth daily.   BENGAY EX Apply 1 application topically daily as needed (shoulder pain).   CINNAMON PO Take 1 capsule by mouth every 7 (seven) days.   docusate sodium 100 MG capsule Commonly known as: Colace Take 1 capsule (100 mg total) by mouth daily as needed for up to 30 doses.   finasteride 5 MG tablet Commonly known as: PROSCAR Take 5 mg by mouth daily.   lisinopril-hydrochlorothiazide 20-25 MG tablet Commonly known as: ZESTORETIC Take 1 tablet by mouth daily.   oxyCODONE-acetaminophen 5-325 MG tablet Commonly known as: Percocet Take 1 tablet by mouth every 4 hours as needed for severe pain.   tamsulosin 0.4 MG Caps capsule Commonly known as: FLOMAX Take 0.8 mg by mouth daily after supper.   VITAMIN B-12 PO Take 1 tablet by mouth once a week.        Followup:   Follow-up Information     [facility]. Schedule an appointment as soon as possible for a visit.   Contact information: [place] [phone]

## 2021-03-23 NOTE — Progress Notes (Signed)
Pyxis was still down. Had RN Roni to witness percocet 5-325mg  override.

## 2021-03-23 NOTE — Plan of Care (Signed)

## 2021-03-23 NOTE — Discharge Instructions (Signed)

## 2021-03-24 ENCOUNTER — Other Ambulatory Visit (HOSPITAL_COMMUNITY): Payer: Self-pay

## 2021-03-24 DIAGNOSIS — R3914 Feeling of incomplete bladder emptying: Secondary | ICD-10-CM | POA: Diagnosis not present

## 2021-03-24 DIAGNOSIS — R339 Retention of urine, unspecified: Secondary | ICD-10-CM | POA: Diagnosis not present

## 2021-03-24 LAB — SURGICAL PATHOLOGY

## 2021-03-31 DIAGNOSIS — D49511 Neoplasm of unspecified behavior of right kidney: Secondary | ICD-10-CM | POA: Diagnosis not present

## 2021-04-01 ENCOUNTER — Telehealth: Payer: Self-pay | Admitting: Oncology

## 2021-04-01 NOTE — Telephone Encounter (Signed)
Scheduled appt per 12/9 referral. Pt is aware of appt date and time.

## 2021-04-13 ENCOUNTER — Other Ambulatory Visit: Payer: Self-pay

## 2021-04-13 ENCOUNTER — Inpatient Hospital Stay: Payer: Medicare Other | Attending: Oncology | Admitting: Oncology

## 2021-04-13 DIAGNOSIS — C649 Malignant neoplasm of unspecified kidney, except renal pelvis: Secondary | ICD-10-CM | POA: Diagnosis not present

## 2021-04-13 NOTE — Progress Notes (Signed)
Reason for the request:    Kidney cancer  HPI: I was asked by Dr. Abner Greenspan to evaluate Edwin Levine for evaluation of renal cell carcinoma.  He is a 70 year old man with history of hypertension and hyperlipidemia who developed hematuria in September 2022.  CT scan at that time revealed a large right kidney mass measuring 14 x 13.6 x 13 cm.  No evidence of metastatic disease or lymphadenopathy noted at that time.  He has subsequently underwent robotic assisted laparoscopic radical nephrectomy that was converted to open on March 21, 2021.  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  Nuclear grade 3 stage T3a.  Since his surgery, he reports feeling well without any major complaints.  He denies any abdominal pain, nausea or discomfort.  He does report urinary symptoms which is not dramatically changed and overall manageable.  He does not report any headaches, blurry vision, syncope or seizures. Does not report any fevers, chills or sweats.  Does not report any cough, wheezing or hemoptysis.  Does not report any chest pain, palpitation, orthopnea or leg edema.  Does not report any nausea, vomiting or abdominal pain.  Does not report any constipation or diarrhea.  Does not report any skeletal complaints.    Does not report frequency, urgency or hematuria.  Does not report any skin rashes or lesions. Does not report any heat or cold intolerance.  Does not report any lymphadenopathy or petechiae.  Does not report any anxiety or depression.  Remaining review of systems is negative.     Past Medical History:  Diagnosis Date   Cancer ([place])    right kidney   GERD (gastroesophageal reflux disease)    Hyperlipidemia    Hypertension    Palpitations    Pre-diabetes   :   Past Surgical History:  Procedure Laterality Date   ELECTROPHYSIOLOGIC STUDY N/A 02/16/2016   Procedure: SVT Ablation;  Surgeon: Evans Lance, MD;  Location: [facility];  Service: Cardiovascular;  Laterality: N/A;   HERNIA REPAIR     ROBOT ASSISTED LAPAROSCOPIC NEPHRECTOMY Right 03/21/2021   Procedure: ATTEMPTED XI ROBOTIC ASSISTED LAPAROSCOPIC RADICAL NEPHRECTOMY, CONVERTED TO RIGHT OPEN NEPHRECTOMY;  Surgeon: Janith Lima, MD;  Location: [facility];  Service: Urology;  Laterality: Right;  :   Current Outpatient Medications:    acetaminophen (TYLENOL) 325 MG tablet, Take 650 mg by mouth every 6 (six) hours as needed for mild pain., Disp: , Rfl:    atorvastatin (LIPITOR) 40 MG tablet, Take 40 mg by mouth daily., Disp: , Rfl:    CINNAMON PO, Take 1 capsule by mouth every 7 (seven) days., Disp: , Rfl:    Cyanocobalamin (VITAMIN B-12 PO), Take 1 tablet by mouth once a week., Disp: , Rfl:    docusate sodium (COLACE) 100 MG capsule, Take 1 capsule (100 mg total) by mouth daily as needed for up to 30 doses., Disp: 30 capsule, Rfl: 0   finasteride (PROSCAR) 5 MG tablet, Take 5 mg by mouth daily., Disp: , Rfl:    lisinopril-hydrochlorothiazide (ZESTORETIC) 20-25 MG tablet, Take 1 tablet by mouth daily. , Disp: , Rfl:    Menthol, Topical Analgesic, (BENGAY EX), Apply 1 application topically daily as needed (shoulder pain)., Disp: , Rfl:    oxyCODONE-acetaminophen (PERCOCET) 5-325 MG tablet, Take 1 tablet by mouth every 4 hours as needed for severe pain., Disp: 20 tablet, Rfl: 0   tamsulosin (FLOMAX) 0.4 MG CAPS capsule,  Take 0.8 mg by mouth daily after supper., Disp: , Rfl: :  No Known Allergies:   Family History  Problem Relation Age of Onset   Heart Problems Mother    Aneurysm Father    Diabetes Neg Hx    Cancer Neg Hx    Stroke Neg Hx   :   Social History   Socioeconomic History   Marital status: Married    Spouse name: Not on file   Number of children: Not on file   Years of education: Not on file   Highest education level: Not on file  Occupational History   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   Social Determinants of Health   Financial Resource Strain: Not on file  Food Insecurity: Not on file  Transportation Needs: Not on file  Physical Activity: Not on file  Stress: Not on file  Social Connections: Not on file  Intimate Partner Violence: Not on file  :  Pertinent items are noted in HPI.  Exam: Blood pressure 119/71, pulse 72, temperature 97.9 F (36.6 C), temperature source Temporal, resp. rate 17, height 5\' 9"  (1.753 m), weight 197 lb 3.2 oz (89.4 kg), SpO2 98 %. ECOG 1 General appearance: alert and cooperative appeared without distress. Head: atraumatic without any abnormalities. Eyes: conjunctivae/corneas clear. PERRL.  Sclera anicteric. Throat: lips, mucosa, and tongue normal; without oral thrush or ulcers. Resp: clear to auscultation bilaterally without rhonchi, wheezes or dullness to percussion. Cardio: regular rate and rhythm, S1, S2 normal, no murmur, click, rub or gallop GI: soft, non-tender; bowel sounds normal; no masses,  no organomegaly Skin: Skin color, texture, turgor normal. No rashes or lesions Lymph nodes: Cervical, supraclavicular, and axillary nodes normal. Neurologic: Grossly normal without any motor, sensory or deep tendon reflexes. Musculoskeletal: No joint deformity or effusion.    Assessment and Plan:   70 year old with:  1.  Right kidney cancer diagnosed in September 2022.  He underwent open radical nephrectomy on 03/21/2021 which showed a renal cell carcinoma with clear and papillary architecture with nuclear grade 3 and a final stage of T3a.  He has no evidence of metastatic disease on initial staging.  Treatment options moving forward at this time  and the natural course of this disease was reviewed.  The role for adjuvant immunotherapy based on KEYNOTE-564 and the use of Pembrolizumab for 1 year were discussed.  This was given every 3 weeks for a total of 17 cycles and results showed improvement in progression free survival.  The fact that he has papillary architecture makes the benefit unclear but certainly could be considered.  Complications including immune mediated issues, pneumonitis, colitis and thyroid disease were discussed.  Alternative treatment option would be active surveillance with surveillance scans which she will have anyways.  After discussion today, he opted against adjuvant immunotherapy at this time.  He favors intervening with systemic therapy only upon relapse.  He also understands that his disease might not be curable at that time.   2.  Elevated PSA: Has been evaluated previously since 2013 with biopsy without any evidence of malignancy.  He continues to follow with Dr. Abner Greenspan with potential repeat MRI and biopsy in the future if his PSA persists.   3.  Follow-up: will be as needed in the future.  We will continue to receive active surveillance under the care of Dr. Abner Greenspan.   60  minutes were dedicated to this visit. The time was spent on reviewing laboratory data, imaging studies, discussing treatment options, reviewing pathology results and answering questions regarding future plan.    A copy of this consult has been forwarded to the requesting physician.

## 2021-04-26 DIAGNOSIS — R339 Retention of urine, unspecified: Secondary | ICD-10-CM | POA: Diagnosis not present

## 2021-04-26 DIAGNOSIS — R3914 Feeling of incomplete bladder emptying: Secondary | ICD-10-CM | POA: Diagnosis not present

## 2021-05-25 DIAGNOSIS — R339 Retention of urine, unspecified: Secondary | ICD-10-CM | POA: Diagnosis not present

## 2021-05-25 DIAGNOSIS — R3914 Feeling of incomplete bladder emptying: Secondary | ICD-10-CM | POA: Diagnosis not present

## 2021-06-22 DIAGNOSIS — M810 Age-related osteoporosis without current pathological fracture: Secondary | ICD-10-CM | POA: Diagnosis not present

## 2021-06-22 DIAGNOSIS — C649 Malignant neoplasm of unspecified kidney, except renal pelvis: Secondary | ICD-10-CM | POA: Diagnosis not present

## 2021-07-06 DIAGNOSIS — J8410 Pulmonary fibrosis, unspecified: Secondary | ICD-10-CM | POA: Diagnosis not present

## 2021-07-06 DIAGNOSIS — N210 Calculus in bladder: Secondary | ICD-10-CM | POA: Diagnosis not present

## 2021-07-06 DIAGNOSIS — D49511 Neoplasm of unspecified behavior of right kidney: Secondary | ICD-10-CM | POA: Diagnosis not present

## 2021-07-06 DIAGNOSIS — I2510 Atherosclerotic heart disease of native coronary artery without angina pectoris: Secondary | ICD-10-CM | POA: Diagnosis not present

## 2021-07-06 DIAGNOSIS — K8020 Calculus of gallbladder without cholecystitis without obstruction: Secondary | ICD-10-CM | POA: Diagnosis not present

## 2021-07-06 DIAGNOSIS — I700 Atherosclerosis of aorta: Secondary | ICD-10-CM | POA: Diagnosis not present

## 2021-07-06 DIAGNOSIS — C641 Malignant neoplasm of right kidney, except renal pelvis: Secondary | ICD-10-CM | POA: Diagnosis not present

## 2021-07-12 DIAGNOSIS — I10 Essential (primary) hypertension: Secondary | ICD-10-CM | POA: Diagnosis not present

## 2021-07-12 DIAGNOSIS — M810 Age-related osteoporosis without current pathological fracture: Secondary | ICD-10-CM | POA: Diagnosis not present

## 2021-07-12 DIAGNOSIS — E559 Vitamin D deficiency, unspecified: Secondary | ICD-10-CM | POA: Diagnosis not present

## 2021-07-12 DIAGNOSIS — Z87440 Personal history of urinary (tract) infections: Secondary | ICD-10-CM | POA: Diagnosis not present

## 2021-07-12 DIAGNOSIS — Z8679 Personal history of other diseases of the circulatory system: Secondary | ICD-10-CM | POA: Diagnosis not present

## 2021-07-12 DIAGNOSIS — R7303 Prediabetes: Secondary | ICD-10-CM | POA: Diagnosis not present

## 2021-07-12 DIAGNOSIS — Z85528 Personal history of other malignant neoplasm of kidney: Secondary | ICD-10-CM | POA: Diagnosis not present

## 2021-07-12 DIAGNOSIS — E782 Mixed hyperlipidemia: Secondary | ICD-10-CM | POA: Diagnosis not present

## 2021-07-18 DIAGNOSIS — R918 Other nonspecific abnormal finding of lung field: Secondary | ICD-10-CM | POA: Diagnosis not present

## 2021-07-18 DIAGNOSIS — D49511 Neoplasm of unspecified behavior of right kidney: Secondary | ICD-10-CM | POA: Diagnosis not present

## 2021-07-19 ENCOUNTER — Other Ambulatory Visit: Payer: Self-pay | Admitting: Urology

## 2021-07-19 ENCOUNTER — Other Ambulatory Visit (HOSPITAL_COMMUNITY): Payer: Self-pay | Admitting: Urology

## 2021-07-19 DIAGNOSIS — D49511 Neoplasm of unspecified behavior of right kidney: Secondary | ICD-10-CM

## 2021-07-19 DIAGNOSIS — R918 Other nonspecific abnormal finding of lung field: Secondary | ICD-10-CM

## 2021-07-21 ENCOUNTER — Telehealth: Payer: Self-pay | Admitting: Oncology

## 2021-07-21 NOTE — Telephone Encounter (Signed)
.  Called patient to schedule appointment per 3/28 inbasket, patient is aware of date and time.   ?

## 2021-07-25 DIAGNOSIS — R3914 Feeling of incomplete bladder emptying: Secondary | ICD-10-CM | POA: Diagnosis not present

## 2021-07-25 DIAGNOSIS — R339 Retention of urine, unspecified: Secondary | ICD-10-CM | POA: Diagnosis not present

## 2021-08-08 ENCOUNTER — Encounter (HOSPITAL_COMMUNITY): Payer: Medicare Other

## 2021-08-08 ENCOUNTER — Ambulatory Visit (HOSPITAL_COMMUNITY)
Admission: RE | Admit: 2021-08-08 | Discharge: 2021-08-08 | Disposition: A | Discharge status: Home / Self Care | Payer: Medicare Other | Source: Ambulatory Visit | Attending: Urology | Admitting: Urology

## 2021-08-08 DIAGNOSIS — R918 Other nonspecific abnormal finding of lung field: Secondary | ICD-10-CM | POA: Insufficient documentation

## 2021-08-08 DIAGNOSIS — D49511 Neoplasm of unspecified behavior of right kidney: Secondary | ICD-10-CM | POA: Diagnosis not present

## 2021-08-08 DIAGNOSIS — Z85528 Personal history of other malignant neoplasm of kidney: Secondary | ICD-10-CM | POA: Diagnosis not present

## 2021-08-08 DIAGNOSIS — I2510 Atherosclerotic heart disease of native coronary artery without angina pectoris: Secondary | ICD-10-CM | POA: Diagnosis not present

## 2021-08-08 DIAGNOSIS — K449 Diaphragmatic hernia without obstruction or gangrene: Secondary | ICD-10-CM | POA: Diagnosis not present

## 2021-08-08 DIAGNOSIS — S2242XA Multiple fractures of ribs, left side, initial encounter for closed fracture: Secondary | ICD-10-CM | POA: Diagnosis not present

## 2021-08-08 LAB — GLUCOSE, CAPILLARY: Glucose-Capillary: 113 mg/dL — ABNORMAL HIGH (ref 70–99)

## 2021-08-08 MED ORDER — FLUDEOXYGLUCOSE F - 18 (FDG) INJECTION
10.0000 | Freq: Once | INTRAVENOUS | Status: AC | PRN
  Administered 2021-08-08: 9.83 via INTRAVENOUS

## 2021-08-09 ENCOUNTER — Other Ambulatory Visit: Payer: Self-pay

## 2021-08-09 ENCOUNTER — Inpatient Hospital Stay: Payer: Medicare Other | Attending: Oncology | Admitting: Oncology

## 2021-08-09 VITALS — BP 167/81 | HR 62 | Temp 97.8°F | Resp 18 | Ht 69.0 in | Wt 209.3 lb

## 2021-08-09 DIAGNOSIS — Z905 Acquired absence of kidney: Secondary | ICD-10-CM | POA: Insufficient documentation

## 2021-08-09 DIAGNOSIS — C649 Malignant neoplasm of unspecified kidney, except renal pelvis: Secondary | ICD-10-CM | POA: Diagnosis not present

## 2021-08-09 DIAGNOSIS — Z85528 Personal history of other malignant neoplasm of kidney: Secondary | ICD-10-CM | POA: Diagnosis not present

## 2021-08-09 NOTE — Progress Notes (Signed)
Hematology and Oncology Follow Up Visit ? ?Edwin Levine ?[id number] ?[birth year] 71 y.o. ?08/09/2021 11:51 AM ?Edwin Magic, MD  ? ?Principle Diagnosis: 71 year old with kidney cancer diagnosed in September 2022.  He was found to have T3a clear-cell and papillary architecture noted. ? ? ?Prior Therapy: ? ?He has subsequently underwent robotic assisted laparoscopic radical nephrectomy that was converted to open on March 21, 2021.  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  Nuclear grade 3 stage T3a. ? ?Current therapy: Active surveillance. ? ?Interim History: Edwin Levine returns today for a follow-up visit.  Since last visit, he underwent surveillance scans with Edwin Levine on July 06, 2021.  The CT scan showed multiple bilateral pulmonary nodules including a 14 mm subsolid posterior right upper lobe nodule as well as a spiculated 1.9 cm posterior right upper lobe nodule.  The 1.1 peripheral right lower nodule is an area of there was included in prior study and was not noted.  Based on these findings he underwent PET scan on July 08, 2021 which showed no hypermetabolic activity noted with the pulmonary nodules on the prior CT.  Overall these nodules felt to have resolved and improved indicating likely inflammatory etiology. ? ?Clinically, he is asymptomatic at this time without any respiratory complaints.  He continues to have urinary difficulties with self-catheterization.  No evidence of cancer on biopsy. ? ? ? ? ?Medications: I have reviewed the patient's current medications.  ?Current Outpatient Medications  ?Medication Sig Dispense Refill  ? acetaminophen (TYLENOL) 325 MG tablet Take 650 mg by mouth every 6 (six) hours as needed for mild pain.    ? atorvastatin (LIPITOR) 40 MG tablet Take 40 mg by mouth daily.    ? CINNAMON PO Take 1 capsule by mouth every 7 (seven) days.    ? Cyanocobalamin (VITAMIN B-12 PO) Take 1 tablet  by mouth once a week.    ? docusate sodium (COLACE) 100 MG capsule Take 1 capsule (100 mg total) by mouth daily as needed for up to 30 doses. 30 capsule 0  ? finasteride (PROSCAR) 5 MG tablet Take 5 mg by mouth daily.    ? lisinopril-hydrochlorothiazide (ZESTORETIC) 20-25 MG tablet Take 1 tablet by mouth daily.     ? Menthol, Topical Analgesic, (BENGAY EX) Apply 1 application topically daily as needed (shoulder pain).    ? oxyCODONE-acetaminophen (PERCOCET) 5-325 MG tablet Take 1 tablet by mouth every 4 hours as needed for severe pain. (Patient not taking: Reported on 04/13/2021) 20 tablet 0  ? tamsulosin (FLOMAX) 0.4 MG CAPS capsule Take 0.8 mg by mouth daily after supper.    ? ?No current facility-administered medications for this visit.  ? ? ? ?Allergies: No Known Allergies ? ? ? ?Physical Exam: ?Blood pressure (!) 167/81, pulse 62, temperature 97.8 ?F (36.6 ?C), temperature source Temporal, resp. rate 18, height '5\' 9"'$  (1.753 m), weight 209 lb 4.8 oz (94.9 kg), SpO2 99 %. ? ?ECOG: 1 ? ? ? ?General appearance: Comfortable appearing without any discomfort ?Head: Normocephalic without any trauma ?Oropharynx: Mucous membranes are moist and pink without any thrush or ulcers. ?Eyes: Pupils are equal and round reactive to light. ?Lymph nodes: No cervical, supraclavicular, inguinal or axillary lymphadenopathy.   ?Heart:regular rate and rhythm.  S1 and S2 without leg edema. ?Lung: Clear without any rhonchi or wheezes.  No dullness to percussion. ?Abdomin: Soft, nontender, nondistended with good bowel sounds.  No hepatosplenomegaly. ?Musculoskeletal: No joint  deformity or effusion.  Full range of motion noted. ?Neurological: No deficits noted on motor, sensory and deep tendon reflex exam. ?Skin: No petechial rash or dryness.  Appeared moist.  ? ? ? ? ?Lab Results: ?Lab Results  ?Component Value Date  ? WBC 12.3 (H) 03/23/2021  ? HGB 11.4 (L) 03/23/2021  ? HCT 33.7 (L) 03/23/2021  ? MCV 94.7 03/23/2021  ? PLT 191 03/23/2021   ? ?  Chemistry   ?   ?Component Value Date/Time  ? NA 135 03/23/2021 0925  ? K 3.9 03/23/2021 0925  ? CL 102 03/23/2021 0925  ? CO2 27 03/23/2021 0925  ? BUN 28 (H) 03/23/2021 0925  ? CREATININE 1.84 (H) 03/23/2021 0925  ?    ?Component Value Date/Time  ? CALCIUM 9.5 03/23/2021 0925  ? ALKPHOS 77 02/15/2016 0401  ? AST 32 02/15/2016 0401  ? ALT 30 02/15/2016 0401  ? BILITOT 1.0 02/15/2016 0401  ?  ? ? ? ?Radiological Studies: ?NM PET Image Initial (PI) Skull Base To Thigh (F-18 FDG) ? ?Result Date: 08/09/2021 ?CLINICAL DATA:  Initial treatment strategy for history of renal cell carcinoma with developing pulmonary nodules. EXAM: NUCLEAR MEDICINE PET SKULL BASE TO THIGH TECHNIQUE: 9.8 mCi F-18 FDG was injected intravenously. Full-ring PET imaging was performed from the skull base to thigh after the radiotracer. CT data was obtained and used for attenuation correction and anatomic localization. Fasting blood glucose: 113 mg/dl COMPARISON:  Chest abdomen and pelvic CTs of 07/06/2021. Abdominopelvic CT 01/14/2021. FINDINGS: Mediastinal blood pool activity: SUV max 2.4 Liver activity: SUV max NA NECK: No areas of abnormal hypermetabolism. Incidental CT findings: No cervical adenopathy. CHEST: No thoracic nodal hypermetabolism. No pulmonary parenchymal hypermetabolism. The posterior right upper lobe mixed attenuation nodule on the prior diagnostic chest CT is not well visualized. The adjacent nodule or area of thickening along the right major fissure is slightly less distinct today, given differences in slice selection. 1.3 cm and not hypermetabolic on 03/2. 1.9 cm on the prior exam. The right lower lobe subpleural solid nodule is no longer identified. Minimal subpleural interstitial thickening in this area including on 88/4 remains. Incidental CT findings: Aortic and coronary artery calcification. ABDOMEN/PELVIS: No abdominopelvic adenopathy. Incidental CT findings: Deferred to recent diagnostic CT. Normal adrenal glands.  Right nephrectomy. Normal noncontrast appearance of the left kidney, liver, pancreas, and bowel loops. Tiny hiatal hernia. Prostatomegaly with bladder stones. SKELETON: Eighth anterolateral rib hypermetabolism corresponds to an interval nonacute fracture on 113/4. Incidental CT findings: none IMPRESSION: 1. No hypermetabolism to correspond to the pulmonary nodules on prior chest CT. Given thicker slice collimation and nondedicated technique, these nodules are felt to be improved and resolved, consistent with an infectious/inflammatory etiology. 2. No evidence of hypermetabolic metastatic disease, status post right nephrectomy. 3. Nonacute but interval lateral left eighth rib fracture since 07/06/2021. 4. Incidental findings, including: Tiny hiatal hernia. Coronary artery atherosclerosis. Aortic Atherosclerosis (ICD10-I70.0). Prostatomegaly with bladder outlet obstruction and bladder stones. Electronically Signed   By: Abigail Miyamoto M.D.   On: 08/09/2021 08:30   ? ? ?Impression and Plan: ? ?71 year old with: ? ?1.  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   ? ?The natural course of this disease was reviewed at this time.  Imaging studies including CT scan as well as a PET scan from March and April respectively were reviewed.  At this time, I see no evidence to suggest metastatic disease.  The  pulmonary nodules appear to have resolved and likely inflammatory.  At this time, I do not recommend any further intervention other than surveillance.  I would recommend continued repeat imaging studies which she is currently undergoing with Edwin Levine.   ? ?If a relapse disease is detected whether it in the pulmonary nodules or any other areas systemic therapy will be considered. ?  ?  ?2.  Elevated PSA: He continues to follow with Edwin Levine regarding this issue.  He has a BPH without any evidence of malignancy.  Continues to self catheterize. ? ?  ?3.   Follow-up: I am happy to see him in the future as needed. ?  ?  ?30  minutes were spent on this encounter.  The time was dedicated to reviewing imaging studies, treatment choices and future plan of care

## 2021-08-22 DIAGNOSIS — R339 Retention of urine, unspecified: Secondary | ICD-10-CM | POA: Diagnosis not present

## 2021-08-22 DIAGNOSIS — R3914 Feeling of incomplete bladder emptying: Secondary | ICD-10-CM | POA: Diagnosis not present

## 2021-09-22 DIAGNOSIS — R339 Retention of urine, unspecified: Secondary | ICD-10-CM | POA: Diagnosis not present

## 2021-09-22 DIAGNOSIS — R3914 Feeling of incomplete bladder emptying: Secondary | ICD-10-CM | POA: Diagnosis not present

## 2021-10-11 DIAGNOSIS — R350 Frequency of micturition: Secondary | ICD-10-CM | POA: Diagnosis not present

## 2021-10-11 DIAGNOSIS — R3912 Poor urinary stream: Secondary | ICD-10-CM | POA: Diagnosis not present

## 2021-10-11 DIAGNOSIS — D49511 Neoplasm of unspecified behavior of right kidney: Secondary | ICD-10-CM | POA: Diagnosis not present

## 2021-10-17 ENCOUNTER — Other Ambulatory Visit: Payer: Self-pay | Admitting: Urology

## 2021-10-22 DIAGNOSIS — R339 Retention of urine, unspecified: Secondary | ICD-10-CM | POA: Diagnosis not present

## 2021-10-22 DIAGNOSIS — R3914 Feeling of incomplete bladder emptying: Secondary | ICD-10-CM | POA: Diagnosis not present

## 2021-11-10 DIAGNOSIS — R350 Frequency of micturition: Secondary | ICD-10-CM | POA: Diagnosis not present

## 2021-11-10 DIAGNOSIS — R3914 Feeling of incomplete bladder emptying: Secondary | ICD-10-CM | POA: Diagnosis not present

## 2021-11-11 NOTE — Progress Notes (Addendum)
COVID Vaccine Completed:  Date of COVID positive in last 90 days:  PCP - Antony Contras, MD Cardiologist - Cristopher Peru, MD  Chest x-ray - 02-15-21 Epic EKG - 02-02-21 Epic Stress Test -  ECHO - greater than 2 years Epic Cardiac Cath -  Pacemaker/ICD device last checked: Spinal Cord Stimulator:  Bowel Prep -   Sleep Study -  CPAP -   Fasting Blood Sugar -  Checks Blood Sugar _____ times a day  Blood Thinner Instructions: Aspirin Instructions: Last Dose:  Activity level:  Can go up a flight of stairs and perform activities of daily living without stopping and without symptoms of chest pain or shortness of breath.  Able to exercise without symptoms  Unable to go up a flight of stairs without symptoms of     Anesthesia review:  SVT post ablation, HTN.  Patient denies shortness of breath, fever, cough and chest pain at PAT appointment  Patient verbalized understanding of instructions that were given to them at the PAT appointment. Patient was also instructed that they will need to review over the PAT instructions again at home before surgery.

## 2021-11-11 NOTE — Patient Instructions (Addendum)
SURGICAL WAITING ROOM VISITATION Patients having surgery or a procedure may have no more than 2 support people in the waiting area - these visitors may rotate.   Children under the age of 74 must have an adult with them who is not the patient. If the patient needs to stay at the hospital during part of their recovery, the visitor guidelines for inpatient rooms apply. Pre-op nurse will coordinate an appropriate time for 1 support person to accompany patient in pre-op.  This support person may not rotate.    Please refer to the [facility] website for the visitor guidelines for Inpatients (after your surgery is over and you are in a regular room).         Your procedure is scheduled on:  11-21-21   Report to [facility] Main Entrance    Report to admitting at 10:30 AM   Call this number if you have problems the morning of surgery [phone]   Do not eat food :After Midnight the night before surgery   After Midnight you may have the following liquids until 9:45 AM DAY OF SURGERY  Clear Liquid Diet Water Black Coffee (sugar ok, NO MILK/CREAM OR CREAMERS)  Tea (sugar ok, NO MILK/CREAM OR CREAMERS) regular and decaf                             Plain Jell-O (NO RED)                                           Fruit ices (not with fruit pulp, NO RED)                                     Popsicles (NO RED)                                                                  Juice: apple, WHITE grape, WHITE cranberry Sports drinks like Gatorade (NO RED)                       If you have questions, please contact your surgeon's office.   FOLLOW  ANY ADDITIONAL PRE OP INSTRUCTIONS YOU RECEIVED FROM YOUR SURGEON'S OFFICE!!!     Oral Hygiene is also important to reduce your risk of infection.                                    Remember - BRUSH YOUR TEETH THE MORNING OF SURGERY WITH YOUR REGULAR TOOTHPASTE   Do NOT smoke after Midnight   Take these medicines the morning of surgery with  A SIP OF WATER:  Tylenol, Atorvastatin, Tamsulosin  DO NOT TAKE ANY ORAL DIABETIC MEDICATIONS DAY OF YOUR SURGERY  Bring CPAP mask and tubing day of surgery.                              You  may not have any metal on your body including  jewelry, and body piercing             Do not wear lotions, powders, cologne, or deodorant              Men may shave face and neck.    Contacts, dentures or bridgework may not be worn into surgery.   Bring small overnight bag day of surgery.  Do not bring valuables to the hospital. [place].    DO NOT [place]. PHARMACY WILL DISPENSE MEDICATIONS LISTED ON YOUR MEDICATION LIST TO YOU DURING YOUR ADMISSION [facility]!  Special Instructions: Bring a copy of your healthcare power of attorney and living will documents the day of surgery if you haven't scanned them before.  Please read over the following fact sheets you were given: IF YOU HAVE QUESTIONS ABOUT YOUR PRE-OP INSTRUCTIONS PLEASE CALL [facility] - Preparing for Surgery Before surgery, you can play an important role.  Because skin is not sterile, your skin needs to be as free of germs as possible.  You can reduce the number of germs on your skin by washing with CHG (chlorahexidine gluconate) soap before surgery.  CHG is an antiseptic cleaner which kills germs and bonds with the skin to continue killing germs even after washing. Please DO NOT use if you have an allergy to CHG or antibacterial soaps.  If your skin becomes reddened/irritated stop using the CHG and inform your nurse when you arrive at Short Stay. Do not shave (including legs and underarms) for at least 48 hours prior to the first CHG shower.  You may shave your face/neck.  Please follow these instructions carefully:  1.  Shower with CHG Soap the night before surgery and the  morning of surgery.  2.  If you choose to wash your hair, wash  your hair first as usual with your normal  shampoo.  3.  After you shampoo, rinse your hair and body thoroughly to remove the shampoo.                             4.  Use CHG as you would any other liquid soap.  You can apply chg directly to the skin and wash.  Gently with a scrungie or clean washcloth.  5.  Apply the CHG Soap to your body ONLY FROM THE NECK DOWN.   Do   not use on face/ open                           Wound or open sores. Avoid contact with eyes, ears mouth and   genitals (private parts).                       Wash face,  Genitals (private parts) with your normal soap.             6.  Wash thoroughly, paying special attention to the area where your    surgery  will be performed.  7.  Thoroughly rinse your body with warm water from the neck down.  8.  DO NOT shower/wash with your normal soap after using and rinsing off the CHG Soap.                9.  Pat yourself dry with a clean towel.            10.  Wear clean pajamas.            11.  Place clean sheets on your bed the night of your first shower and do not  sleep with pets. Day of Surgery : Do not apply any lotions/deodorants the morning of surgery.  Please wear clean clothes to the hospital/surgery center.  FAILURE TO FOLLOW THESE INSTRUCTIONS MAY RESULT IN THE CANCELLATION OF YOUR SURGERY  PATIENT SIGNATURE_________________________________  NURSE SIGNATURE__________________________________  ________________________________________________________________________

## 2021-11-15 ENCOUNTER — Other Ambulatory Visit: Payer: Self-pay

## 2021-11-15 ENCOUNTER — Encounter (HOSPITAL_COMMUNITY): Payer: Self-pay

## 2021-11-15 ENCOUNTER — Encounter (HOSPITAL_COMMUNITY)
Admission: RE | Admit: 2021-11-15 | Discharge: 2021-11-15 | Disposition: A | Discharge status: Home / Self Care | Payer: Medicare Other | Source: Ambulatory Visit | Attending: Urology | Admitting: Urology

## 2021-11-15 VITALS — BP 154/98 | HR 64 | Temp 98.5°F | Resp 16 | Ht 68.5 in | Wt 198.4 lb

## 2021-11-15 DIAGNOSIS — Z01818 Encounter for other preprocedural examination: Secondary | ICD-10-CM

## 2021-11-15 DIAGNOSIS — R7303 Prediabetes: Secondary | ICD-10-CM | POA: Insufficient documentation

## 2021-11-15 DIAGNOSIS — I2510 Atherosclerotic heart disease of native coronary artery without angina pectoris: Secondary | ICD-10-CM | POA: Diagnosis not present

## 2021-11-15 HISTORY — DX: Unspecified osteoarthritis, unspecified site: M19.90

## 2021-11-15 HISTORY — DX: Depression, unspecified: F32.A

## 2021-11-15 HISTORY — DX: Supraventricular tachycardia: I47.1

## 2021-11-15 LAB — HEMOGLOBIN A1C
Hgb A1c MFr Bld: 6.1 % — ABNORMAL HIGH (ref 4.8–5.6)
Mean Plasma Glucose: 128.37 mg/dL

## 2021-11-15 LAB — BASIC METABOLIC PANEL
Anion gap: 9 (ref 5–15)
BUN: 16 mg/dL (ref 8–23)
CO2: 27 mmol/L (ref 22–32)
Calcium: 9.9 mg/dL (ref 8.9–10.3)
Chloride: 102 mmol/L (ref 98–111)
Creatinine, Ser: 1.2 mg/dL (ref 0.61–1.24)
GFR, Estimated: >60 mL/min (ref >60)
Glucose, Bld: 112 mg/dL — ABNORMAL HIGH (ref 70–99)
Potassium: 3.6 mmol/L (ref 3.5–5.1)
Sodium: 138 mmol/L (ref 135–145)

## 2021-11-15 LAB — GLUCOSE, CAPILLARY: Glucose-Capillary: 110 mg/dL — ABNORMAL HIGH (ref 70–99)

## 2021-11-21 ENCOUNTER — Ambulatory Visit (HOSPITAL_BASED_OUTPATIENT_CLINIC_OR_DEPARTMENT_OTHER): Payer: Medicare Other | Admitting: Anesthesiology

## 2021-11-21 ENCOUNTER — Encounter (HOSPITAL_COMMUNITY): Payer: Self-pay | Admitting: Urology

## 2021-11-21 ENCOUNTER — Encounter (HOSPITAL_COMMUNITY)
Admission: RE | Disposition: A | Discharge status: Home / Self Care | Payer: Self-pay | Source: Home / Self Care | Attending: Urology

## 2021-11-21 ENCOUNTER — Ambulatory Visit (HOSPITAL_COMMUNITY): Payer: Medicare Other | Admitting: Physician Assistant

## 2021-11-21 ENCOUNTER — Other Ambulatory Visit: Payer: Self-pay

## 2021-11-21 ENCOUNTER — Ambulatory Visit (HOSPITAL_COMMUNITY)
Admission: RE | Admit: 2021-11-21 | Discharge: 2021-11-22 | Disposition: A | Discharge status: Home / Self Care | Payer: Medicare Other | Attending: Urology | Admitting: Urology

## 2021-11-21 DIAGNOSIS — N400 Enlarged prostate without lower urinary tract symptoms: Secondary | ICD-10-CM | POA: Diagnosis present

## 2021-11-21 DIAGNOSIS — Z905 Acquired absence of kidney: Secondary | ICD-10-CM | POA: Diagnosis not present

## 2021-11-21 DIAGNOSIS — M1990 Unspecified osteoarthritis, unspecified site: Secondary | ICD-10-CM | POA: Diagnosis not present

## 2021-11-21 DIAGNOSIS — N138 Other obstructive and reflux uropathy: Secondary | ICD-10-CM | POA: Insufficient documentation

## 2021-11-21 DIAGNOSIS — Z85528 Personal history of other malignant neoplasm of kidney: Secondary | ICD-10-CM | POA: Diagnosis not present

## 2021-11-21 DIAGNOSIS — R3914 Feeling of incomplete bladder emptying: Secondary | ICD-10-CM | POA: Diagnosis not present

## 2021-11-21 DIAGNOSIS — N3289 Other specified disorders of bladder: Secondary | ICD-10-CM | POA: Insufficient documentation

## 2021-11-21 DIAGNOSIS — Z9079 Acquired absence of other genital organ(s): Secondary | ICD-10-CM | POA: Insufficient documentation

## 2021-11-21 DIAGNOSIS — R972 Elevated prostate specific antigen [PSA]: Secondary | ICD-10-CM | POA: Insufficient documentation

## 2021-11-21 DIAGNOSIS — N210 Calculus in bladder: Secondary | ICD-10-CM | POA: Diagnosis not present

## 2021-11-21 DIAGNOSIS — R338 Other retention of urine: Secondary | ICD-10-CM | POA: Insufficient documentation

## 2021-11-21 DIAGNOSIS — N320 Bladder-neck obstruction: Secondary | ICD-10-CM

## 2021-11-21 DIAGNOSIS — I10 Essential (primary) hypertension: Secondary | ICD-10-CM | POA: Diagnosis not present

## 2021-11-21 DIAGNOSIS — R7303 Prediabetes: Secondary | ICD-10-CM

## 2021-11-21 DIAGNOSIS — N401 Enlarged prostate with lower urinary tract symptoms: Secondary | ICD-10-CM

## 2021-11-21 DIAGNOSIS — Z79899 Other long term (current) drug therapy: Secondary | ICD-10-CM | POA: Insufficient documentation

## 2021-11-21 HISTORY — PX: CYSTOSCOPY WITH LITHOLAPAXY: SHX1425

## 2021-11-21 HISTORY — PX: TRANSURETHRAL RESECTION OF PROSTATE: SHX73

## 2021-11-21 LAB — CBC
HCT: 36.5 % — ABNORMAL LOW (ref 39.0–52.0)
Hemoglobin: 12.8 g/dL — ABNORMAL LOW (ref 13.0–17.0)
MCH: 32.7 pg (ref 26.0–34.0)
MCHC: 35.1 g/dL (ref 30.0–36.0)
MCV: 93.4 fL (ref 80.0–100.0)
Platelets: 197 10*3/uL (ref 150–400)
RBC: 3.91 MIL/uL — ABNORMAL LOW (ref 4.22–5.81)
RDW: 12 % (ref 11.5–15.5)
WBC: 10.1 10*3/uL (ref 4.0–10.5)
nRBC: 0 % (ref 0.0–0.2)

## 2021-11-21 LAB — BASIC METABOLIC PANEL
Anion gap: 10 (ref 5–15)
BUN: 21 mg/dL (ref 8–23)
CO2: 25 mmol/L (ref 22–32)
Calcium: 9.3 mg/dL (ref 8.9–10.3)
Chloride: 105 mmol/L (ref 98–111)
Creatinine, Ser: 1.31 mg/dL — ABNORMAL HIGH (ref 0.61–1.24)
GFR, Estimated: 59 mL/min — ABNORMAL LOW (ref >60)
Glucose, Bld: 164 mg/dL — ABNORMAL HIGH (ref 70–99)
Potassium: 3.6 mmol/L (ref 3.5–5.1)
Sodium: 140 mmol/L (ref 135–145)

## 2021-11-21 LAB — POCT I-STAT, CHEM 8
BUN: 22 mg/dL (ref 8–23)
Calcium, Ion: 1.23 mmol/L (ref 1.15–1.40)
Chloride: 98 mmol/L (ref 98–111)
Creatinine, Ser: 1.3 mg/dL — ABNORMAL HIGH (ref 0.61–1.24)
Glucose, Bld: 149 mg/dL — ABNORMAL HIGH (ref 70–99)
HCT: 39 % (ref 39.0–52.0)
Hemoglobin: 13.3 g/dL (ref 13.0–17.0)
Potassium: 3.3 mmol/L — ABNORMAL LOW (ref 3.5–5.1)
Sodium: 137 mmol/L (ref 135–145)
TCO2: 26 mmol/L (ref 22–32)

## 2021-11-21 LAB — GLUCOSE, CAPILLARY: Glucose-Capillary: 170 mg/dL — ABNORMAL HIGH (ref 70–99)

## 2021-11-21 SURGERY — TURP (TRANSURETHRAL RESECTION OF PROSTATE)
Anesthesia: General

## 2021-11-21 MED ORDER — MORPHINE SULFATE (PF) 2 MG/ML IV SOLN: 2.0000 mg | INTRAVENOUS | Status: DC | PRN

## 2021-11-21 MED ORDER — ATORVASTATIN CALCIUM 40 MG PO TABS
40.0000 mg | ORAL_TABLET | Freq: Every day | ORAL | Status: DC
  Administered 2021-11-22: 40 mg via ORAL
  Filled 2021-11-21: qty 1

## 2021-11-21 MED ORDER — SODIUM CHLORIDE 0.45 % IV SOLN: INTRAVENOUS | Status: DC

## 2021-11-21 MED ORDER — PROPOFOL 10 MG/ML IV BOLUS
INTRAVENOUS | Status: AC
  Filled 2021-11-21: qty 20

## 2021-11-21 MED ORDER — EPHEDRINE 5 MG/ML INJ
INTRAVENOUS | Status: AC
  Filled 2021-11-21: qty 5

## 2021-11-21 MED ORDER — PHENYLEPHRINE HCL-NACL 20-0.9 MG/250ML-% IV SOLN
INTRAVENOUS | Status: DC | PRN
  Administered 2021-11-21: 40 ug/min via INTRAVENOUS

## 2021-11-21 MED ORDER — ONDANSETRON HCL 4 MG/2ML IJ SOLN: 4.0000 mg | Freq: Once | INTRAMUSCULAR | Status: DC | PRN

## 2021-11-21 MED ORDER — EPHEDRINE SULFATE-NACL 50-0.9 MG/10ML-% IV SOSY
PREFILLED_SYRINGE | INTRAVENOUS | Status: DC | PRN
  Administered 2021-11-21: 5 mg via INTRAVENOUS
  Administered 2021-11-21: 15 mg via INTRAVENOUS
  Administered 2021-11-21: 5 mg via INTRAVENOUS
  Administered 2021-11-21: 10 mg via INTRAVENOUS

## 2021-11-21 MED ORDER — PHENYLEPHRINE HCL (PRESSORS) 10 MG/ML IV SOLN
INTRAVENOUS | Status: AC
  Filled 2021-11-21: qty 1

## 2021-11-21 MED ORDER — ONDANSETRON HCL 4 MG/2ML IJ SOLN
INTRAMUSCULAR | Status: DC | PRN
  Administered 2021-11-21: 4 mg via INTRAVENOUS

## 2021-11-21 MED ORDER — PROPOFOL 10 MG/ML IV BOLUS
INTRAVENOUS | Status: DC | PRN
  Administered 2021-11-21: 120 mg via INTRAVENOUS
  Administered 2021-11-21 (×2): 20 mg via INTRAVENOUS

## 2021-11-21 MED ORDER — ESCITALOPRAM OXALATE 5 MG PO TABS
5.0000 mg | ORAL_TABLET | Freq: Every day | ORAL | Status: DC
  Administered 2021-11-21 – 2021-11-22 (×2): 5 mg via ORAL
  Filled 2021-11-21 (×2): qty 1

## 2021-11-21 MED ORDER — AMISULPRIDE (ANTIEMETIC) 5 MG/2ML IV SOLN: 10.0000 mg | Freq: Once | INTRAVENOUS | Status: DC | PRN

## 2021-11-21 MED ORDER — HYDROMORPHONE HCL 1 MG/ML IJ SOLN: 0.2500 mg | INTRAMUSCULAR | Status: DC | PRN

## 2021-11-21 MED ORDER — ACETAMINOPHEN 325 MG PO TABS
650.0000 mg | ORAL_TABLET | ORAL | Status: DC | PRN
  Administered 2021-11-21: 650 mg via ORAL
  Filled 2021-11-21: qty 2

## 2021-11-21 MED ORDER — DIPHENHYDRAMINE HCL 12.5 MG/5ML PO ELIX: 12.5000 mg | ORAL_SOLUTION | Freq: Four times a day (QID) | ORAL | Status: DC | PRN

## 2021-11-21 MED ORDER — CEPHALEXIN 500 MG PO CAPS: 500.0000 mg | ORAL_CAPSULE | Freq: Two times a day (BID) | ORAL | 0 refills | Status: AC

## 2021-11-21 MED ORDER — HYDROCHLOROTHIAZIDE 25 MG PO TABS
25.0000 mg | ORAL_TABLET | Freq: Every day | ORAL | Status: DC
  Administered 2021-11-22: 25 mg via ORAL
  Filled 2021-11-21: qty 1

## 2021-11-21 MED ORDER — ENSURE ENLIVE PO LIQD: 237.0000 mL | Freq: Two times a day (BID) | ORAL | Status: DC

## 2021-11-21 MED ORDER — FENTANYL CITRATE (PF) 100 MCG/2ML IJ SOLN
INTRAMUSCULAR | Status: AC
  Filled 2021-11-21: qty 2

## 2021-11-21 MED ORDER — POLYETHYLENE GLYCOL 3350 17 G PO PACK: 17.0000 g | PACK | Freq: Every day | ORAL | Status: DC | PRN

## 2021-11-21 MED ORDER — SODIUM CHLORIDE 0.9 % IR SOLN: 3000.0000 mL | Status: DC

## 2021-11-21 MED ORDER — SODIUM CHLORIDE 0.9% FLUSH: 3.0000 mL | INTRAVENOUS | Status: DC | PRN

## 2021-11-21 MED ORDER — DOCUSATE SODIUM 100 MG PO CAPS: 100.0000 mg | ORAL_CAPSULE | Freq: Every day | ORAL | 0 refills | Status: DC | PRN

## 2021-11-21 MED ORDER — DIPHENHYDRAMINE HCL 50 MG/ML IJ SOLN: 12.5000 mg | Freq: Four times a day (QID) | INTRAMUSCULAR | Status: DC | PRN

## 2021-11-21 MED ORDER — OXYCODONE HCL 5 MG PO TABS: 5.0000 mg | ORAL_TABLET | Freq: Once | ORAL | Status: DC | PRN

## 2021-11-21 MED ORDER — DEXAMETHASONE SODIUM PHOSPHATE 10 MG/ML IJ SOLN
INTRAMUSCULAR | Status: DC | PRN
  Administered 2021-11-21: 10 mg via INTRAVENOUS

## 2021-11-21 MED ORDER — PHENYLEPHRINE 80 MCG/ML (10ML) SYRINGE FOR IV PUSH (FOR BLOOD PRESSURE SUPPORT)
PREFILLED_SYRINGE | INTRAVENOUS | Status: AC
  Filled 2021-11-21: qty 10

## 2021-11-21 MED ORDER — ACETAMINOPHEN 10 MG/ML IV SOLN
1000.0000 mg | Freq: Once | INTRAVENOUS | Status: DC | PRN
Start: 2021-11-21 — End: 2021-11-21

## 2021-11-21 MED ORDER — OXYCODONE-ACETAMINOPHEN 5-325 MG PO TABS
1.0000 | ORAL_TABLET | ORAL | Status: DC | PRN
  Administered 2021-11-22: 1 via ORAL
  Filled 2021-11-21: qty 1

## 2021-11-21 MED ORDER — LISINOPRIL-HYDROCHLOROTHIAZIDE 20-25 MG PO TABS: 1.0000 | ORAL_TABLET | Freq: Every day | ORAL | Status: DC

## 2021-11-21 MED ORDER — MIDAZOLAM HCL 2 MG/2ML IJ SOLN
INTRAMUSCULAR | Status: AC
  Filled 2021-11-21: qty 2

## 2021-11-21 MED ORDER — ONDANSETRON HCL 4 MG/2ML IJ SOLN: 4.0000 mg | INTRAMUSCULAR | Status: DC | PRN

## 2021-11-21 MED ORDER — OXYCODONE HCL 5 MG/5ML PO SOLN: 5.0000 mg | Freq: Once | ORAL | Status: DC | PRN

## 2021-11-21 MED ORDER — LIDOCAINE HCL (PF) 2 % IJ SOLN
INTRAMUSCULAR | Status: AC
  Filled 2021-11-21: qty 5

## 2021-11-21 MED ORDER — SODIUM CHLORIDE 0.9% FLUSH: 3.0000 mL | Freq: Two times a day (BID) | INTRAVENOUS | Status: DC

## 2021-11-21 MED ORDER — OXYCODONE-ACETAMINOPHEN 5-325 MG PO TABS: 1.0000 | ORAL_TABLET | ORAL | 0 refills | Status: DC | PRN

## 2021-11-21 MED ORDER — SODIUM CHLORIDE 0.9 % IV SOLN: 250.0000 mL | INTRAVENOUS | Status: DC | PRN

## 2021-11-21 MED ORDER — PHENYLEPHRINE 80 MCG/ML (10ML) SYRINGE FOR IV PUSH (FOR BLOOD PRESSURE SUPPORT)
PREFILLED_SYRINGE | INTRAVENOUS | Status: DC | PRN
  Administered 2021-11-21: 80 ug via INTRAVENOUS
  Administered 2021-11-21: 240 ug via INTRAVENOUS
  Administered 2021-11-21 (×2): 80 ug via INTRAVENOUS
  Administered 2021-11-21: 160 ug via INTRAVENOUS
  Administered 2021-11-21: 80 ug via INTRAVENOUS
  Administered 2021-11-21: 160 ug via INTRAVENOUS

## 2021-11-21 MED ORDER — LIDOCAINE 2% (20 MG/ML) 5 ML SYRINGE
INTRAMUSCULAR | Status: DC | PRN
  Administered 2021-11-21: 100 mg via INTRAVENOUS

## 2021-11-21 MED ORDER — DEXAMETHASONE SODIUM PHOSPHATE 10 MG/ML IJ SOLN
INTRAMUSCULAR | Status: AC
  Filled 2021-11-21: qty 1

## 2021-11-21 MED ORDER — ZOLPIDEM TARTRATE 5 MG PO TABS: 5.0000 mg | ORAL_TABLET | Freq: Every evening | ORAL | Status: DC | PRN

## 2021-11-21 MED ORDER — SODIUM CHLORIDE 0.9 % IR SOLN
Status: DC | PRN
  Administered 2021-11-21: 33000 mL via INTRAVESICAL

## 2021-11-21 MED ORDER — OXYBUTYNIN CHLORIDE ER 5 MG PO TB24
10.0000 mg | ORAL_TABLET | Freq: Every day | ORAL | Status: DC
  Administered 2021-11-21 – 2021-11-22 (×2): 10 mg via ORAL
  Filled 2021-11-21 (×2): qty 2

## 2021-11-21 MED ORDER — ORAL CARE MOUTH RINSE: 15.0000 mL | Freq: Once | OROMUCOSAL | Status: AC

## 2021-11-21 MED ORDER — FENTANYL CITRATE (PF) 100 MCG/2ML IJ SOLN
INTRAMUSCULAR | Status: DC | PRN
  Administered 2021-11-21 (×2): 25 ug via INTRAVENOUS
  Administered 2021-11-21: 50 ug via INTRAVENOUS
  Administered 2021-11-21 (×2): 25 ug via INTRAVENOUS
  Administered 2021-11-21: 50 ug via INTRAVENOUS

## 2021-11-21 MED ORDER — ONDANSETRON HCL 4 MG/2ML IJ SOLN
INTRAMUSCULAR | Status: AC
  Filled 2021-11-21: qty 2

## 2021-11-21 MED ORDER — LACTATED RINGERS IV SOLN: INTRAVENOUS | Status: DC

## 2021-11-21 MED ORDER — SODIUM CHLORIDE 0.9 % IV SOLN
1.0000 g | INTRAVENOUS | Status: DC
  Administered 2021-11-21: 1 g via INTRAVENOUS
  Filled 2021-11-21 (×2): qty 10

## 2021-11-21 MED ORDER — CEFAZOLIN SODIUM-DEXTROSE 2-4 GM/100ML-% IV SOLN
2.0000 g | Freq: Once | INTRAVENOUS | Status: AC
  Administered 2021-11-21: 2 g via INTRAVENOUS
  Filled 2021-11-21: qty 100

## 2021-11-21 MED ORDER — MIDAZOLAM HCL 5 MG/5ML IJ SOLN
INTRAMUSCULAR | Status: DC | PRN
  Administered 2021-11-21: 2 mg via INTRAVENOUS

## 2021-11-21 MED ORDER — CHLORHEXIDINE GLUCONATE 0.12 % MT SOLN
15.0000 mL | Freq: Once | OROMUCOSAL | Status: AC
  Administered 2021-11-21: 15 mL via OROMUCOSAL

## 2021-11-21 MED ORDER — LISINOPRIL 20 MG PO TABS
20.0000 mg | ORAL_TABLET | Freq: Every day | ORAL | Status: DC
  Administered 2021-11-22: 20 mg via ORAL
  Filled 2021-11-21: qty 1

## 2021-11-21 SURGICAL SUPPLY — 28 items
BAG DRN RND TRDRP ANRFLXCHMBR (UROLOGICAL SUPPLIES) ×1
BAG URINE DRAIN 2000ML AR STRL (UROLOGICAL SUPPLIES) ×3 IMPLANT
BAG URO CATCHER STRL LF (MISCELLANEOUS) ×3 IMPLANT
CATH FOLEY 3WAY 30CC 22FR (CATHETERS) ×1 IMPLANT
CATH FOLEY 3WAY 30CC 24FR (CATHETERS)
CATH URTH STD 24FR FL 3W 2 (CATHETERS) IMPLANT
CLOTH BEACON ORANGE TIMEOUT ST (SAFETY) ×3 IMPLANT
DRAPE FOOT SWITCH (DRAPES) ×3 IMPLANT
ELECT REM PT RETURN 15FT ADLT (MISCELLANEOUS) IMPLANT
GLOVE BIO SURGEON STRL SZ7 (GLOVE) ×3 IMPLANT
GLOVE SURG LX 7.5 STRW (GLOVE) ×1
GLOVE SURG LX STRL 7.5 STRW (GLOVE) ×2 IMPLANT
GOWN STRL REUS W/ TWL XL LVL3 (GOWN DISPOSABLE) ×2 IMPLANT
GOWN STRL REUS W/TWL XL LVL3 (GOWN DISPOSABLE) ×2
GUIDEWIRE STR DUAL SENSOR (WIRE) IMPLANT
HOLDER FOLEY CATH W/STRAP (MISCELLANEOUS) ×1 IMPLANT
IV CATH 14GX2 1/4 (CATHETERS) IMPLANT
KIT TURNOVER KIT A (KITS) IMPLANT
LASER FIB FLEXIVA PULSE ID 550 (Laser) IMPLANT
LASER FIB FLEXIVA PULSE ID 910 (Laser) ×1 IMPLANT
LOOP CUT BIPOLAR 24F LRG (ELECTROSURGICAL) ×1 IMPLANT
MANIFOLD NEPTUNE II (INSTRUMENTS) ×3 IMPLANT
PACK CYSTO (CUSTOM PROCEDURE TRAY) ×3 IMPLANT
SYR 30ML LL (SYRINGE) ×3 IMPLANT
SYR TOOMEY IRRIG 70ML (MISCELLANEOUS) ×2
SYRINGE TOOMEY IRRIG 70ML (MISCELLANEOUS) ×2 IMPLANT
TUBING CONNECTING 10 (TUBING) ×3 IMPLANT
TUBING UROLOGY SET (TUBING) ×3 IMPLANT

## 2021-11-21 NOTE — H&P (Addendum)
CC/HPI: Edwin Levine is a 71 year old male seen in follow-up with renal cell carcinoma, history of elevated PSA, BPH with LUTS and incomplete bladder emptying.   1. 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 Negative surgical margins. ([id number])  -Preop CXR 01/2021 NED  -CT C/A/P post op 07/06/21 (48mo: No evidence of local recurrence or metastatic disease in the abdomen or pelvis. He does have multiple bilateral pulmonary nodules including a 1.4 cm posterior right upper pole nodule and adjacent irregular and spiculated 1.9 cm posterior right upper lobe nodule. He has a 1.1 cm peripheral right lower lobe nodule.  -Creatinine in 06/2021 is 1.1 with EGFR 68.6.  -PET scan 08/08/2021: No hypermetabolism to correspond to pulmonary nodules. These are consistent with infectious/inflammatory etiology. No evidence of metastatic disease.  -He denies abdominal pain or flank pain. He denies chest pain or shortness of breath.   2. Elevated PSA:  He has had a history of an elevated PSA and his first TRUS biopsy on 08/08/2011 revealed a 71 cc prostate with pathology revealing high-grade PIN but no other evidence of cancer or dysplasia. Repeat TRUS biopsy on 06/03/2019 revealed prostate volume 108 g with pathology revealing BPH only with 1 core positive for HGPIN. Prebiopsy PSA was 10.5. His most recent PSA on 12/13/2020 was 10.5. This represents a decline from 11.5 in 05/2020. He denies bone pain or unexpected weight loss. He has stable appetite. We discussed that his elevated PSA is likely due to the large size of his gland. This time, he is not interested in prostate MRI or repeat TRUS biopsy.   3. BPH/LUTS: He has stable lower urinary tract symptoms. His IPSS score is 24, QOL 3. He does report a sensation of  incomplete bladder emptying, frequency, intermittency, urgency, weak flow stream and 1 time nocturia. He undergoes CIC 2-3 times daily however states that recently, he has had to do this up to 5-6 times daily. He states this is effective in decreasing his nocturia episodes. He takes tamsulosin 0.8 mg daily. He he would like to proceed with TURP.   #5. Bladder stones: CT hematuria protocol 01/17/2019 with multiple bladder stones measuring up to 1.6 cm. This was confirmed via cystoscopy 01/26/2021.   Does have a history of a right testicular injury and underwent a right orchiectomy as a child after being struck with a baseball in his right testicle.   He follows with cardiology, Dr. TLovena Le He has a history of prior ablation for SVT. He denies taking anticoagulation. He has no prior abdominal surgeries. He will need cardiac clearance.   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   11/10/2021: Patient with above-noted history. Here today for preoperative appointment prior to undergoing TURP and cystolitholopaxy with Dr. GAbner Greenspanon 7/31. Since last visit his primary care placed him on an antidepressant but patient stopped the medication after a week. He denies any other changes in past medical history, prescription medications taken on daily basis, no interval surgical or procedural intervention. He continues tamsulosin, above-mentioned lower urinary tract symptoms have not worsened since time of last assessment. He does continue to perform CIC 3x daily without issue. No interval dysuria or gross hematuria. Patient denies any recent fever/chills, nausea/vomiting, chest pain or shortness of breath.     ALLERGIES:  No Allergies No Known Drug Allergies    MEDICATIONS: Tamsulosin Hcl 0.4 mg capsule TAKE 2 CAPSULES BY MOUTH AT BEDTIME  Lipitor 40 MG Oral Tablet Oral  Tamsulosin HCl - 0.4 MG Oral Capsule 1 Oral Daily  Tylenol     Notes: naproxen prn  lisinopril/HCTZ  20/87m QD    GU PSH: Cystoscopy - 01/26/2021 Locm 300-399Mg/Ml Iodine,1Ml - 07/06/2021, 01/14/2021 Prostate Needle Biopsy - 2021, 2013 Radical nephrectomy (open) - 03/21/2021 Simple orchiectomy - 2013       PSH Notes: Biopsy Of The Prostate Needle, Hernia Repair, Orchiectomy Right  right inguinal hernia repair  repair of undescended testicles at age 71   NON-GU PSH: Hernia Repair - 2013 Surgical Pathology, Gross And Microscopic Examination For Prostate Needle - 2021     GU PMH: BPH w/LUTS - 10/11/2021, - 01/26/2021, - 12/30/2020, - 06/14/2020 (Stable), He has marked BPH by exam. I am going to increase his tamsulosin and when he returns if he continues to have an elevated PVR I would consider placing him on finasteride. I discussed that briefly with him today., - 2019 (Stable), His prostate remains enlarged on examination. He continues to perform intermittent self-catheterization, - 2017, Benign prostatic hypertrophy with weak urinary stream, - 2016 Elevated PSA - 10/11/2021, - 01/26/2021, - 12/30/2020, - 06/14/2020, His biopsy was again negative so my recommendation is a recheck the PSA again in 6 months with DRE and PSA again in 12 months., - 2021, - 2021, Although his prostate is benign to exam his PSA has steadily risen and therefore I have recommended we perform a repeat biopsy. He takes a baby aspirin and will stop that., - 2021 (Stable), His PSA remains elevated at 8.2 for with no abnormality noted on DRE and this has been at this level with some variation for the past 2 years. I have recommended continued monitoring with a repeat PSA in 3 months and then again in 6 months., - 2020 (Stable), His PSA remains elevated but stable. It is currently 7.73 with a free to total ratio of 23. I think his very large prostate is the source of his elevated PSA but will continue to follow this closely., - 2019 (Stable), I am going to plan to have him back in 3 months for a recheck of his PSA and then again 3 months  after that for DRE, - 2019 (Stable), His prostate remains benign to exam and his PSA at 5.81 remains stable over time. He had a negative biopsy previously. I want to continue to monitor him every 6 months with a PSA in 6 months and then PSA and DRE again in 12 months., - 2018 (Stable), The prostate remains benign to examination. In addition his PSA at 5.61 remains stable and is actually lower than it has been over the past year. I will continue to monitor him with DRE and PSA every 6 months., - 2017, - 2017, Elevated prostate specific antigen (PSA), - 2017 Right renal neoplasm - 10/11/2021, - 07/18/2021, - 07/06/2021 (Stable), - 03/31/2021, - 03/01/2021, - 01/26/2021 Urinary Frequency - 10/11/2021, - 01/26/2021, - 12/30/2020, - 06/14/2020 Weak Urinary Stream - 10/11/2021, - 01/26/2021, - 06/14/2020 Gross hematuria - 03/01/2021, - 01/14/2021, - 12/30/2020 Incomplete bladder emptying - 01/26/2021, - 12/30/2020 (Stable), - 06/14/2020 (Stable), He does require CIC before going to bed and this allows him to sleep throughout the night. I am going to see if the increased dosage of tamsulosin helps empty his bladder better. I will increase his dosage  to 0.8 mg HS., - 2019 Chronic cystitis (w/o hematuria) (Stable), Although he did have some pyuria and bacteriuria today he is not having any symptoms. We did discuss continuing his Bactrim at night but he is going to reduce the dosage to 1/2 pill. - 2020, (Stable), He has a history of persistent Klebsiella infection and is having some voiding symptoms so I am going to have him complete a course of antibiotics and then place him on low-dose suppression. My hope is that this will help with his voiding symptoms and better emptying of his bladder., - 2019 BPH w/o LUTS (Stable), He has a markedly enlarged prostate but has no voiding symptoms. In fact he has been able to stop his Flomax and noticed no difference in his voiding pattern. - 2018 Acute Cystitis/UTI (Acute), He appears to have an acute  cystitis today. I will culture his urine and treat accordingly sensitivities. - 2017 Urinary Retention, drug induced - 2017 Urinary Retention, Acute urinary retention - 2016 CIS of prostate/prostatic urethra, PIN III (prostatic intraepithelial neoplasia III) - 2015 Primary hypogonadism, Hypogonadism, testicular - 2015 ED due to arterial insufficiency, Erectile dysfunction due to arterial insufficiency - 2014      PMH Notes: Hypogonadism: The patient had a right orchiectomy in the 1960s and was found in 8/12 to have a free testosterone of 5.5 (7.2-24). He was started on AndroGel at 2 pumps q. day and a repeat testosterone in 1/13 was noted to be 467/6.1. His AndroGel was increased to 3 pumps q. day. He had been experiencing mild erectile dysfunction that had responded to phosphodiesterase inhibitors initially. Once he started testosterone replacement he found he did not need further oral medication for his erectile dysfunction. He stopped taking the medication in 6/14 due to the multiple ads seen on TV by lawyers. He did not want to pursue further treatment.  Current treatment: None   Urinary retention: He developed urinary retention on 02/03/15 and was seen in the ER. He was found to have 900 cc in his bladder when a catheter was placed and his urine was nitrite positive but had only 0-2 white blood cells.  He was placed on an alpha-blocker and has subsequently failed multiple voiding trials. I had him evaluated by physical therapy to see if this would result in improvement in his ability to urinate.  When I saw him in 11/16 he was still dealing with urinary retention and had been to physical therapy but failed to return due to lack of results. He had been taught intermittent self-catheterization and I recommended urodynamics but he did not follow-up for that study.  He performs self-catheterization about once every 2-3 weeks.   NON-GU PMH: Other nonspecific abnormal finding of lung field -  07/18/2021 Pyuria/other UA findings (Stable), He has a history of infections and had a lot a white cells in his urine today but only rare bacteria. In preparation for his biopsy I will culture his urine. - 2021, I noted white blood cells and bacteria in his urine that was nitrite positive. I will culture, - 2019 Bacteriuria, He has bacteriuria and pyuria today and he has no voiding symptoms whatsoever. This would be considered asymptomatic bacteriuria and not an acute cystitis/UTI. As such I have not recommended treatment with antibiotics as continued treatment of this condition with antibiotics will inevitably result in progressively resistant bacteria. Therefore I will obtain a culture today to use to guide therapy should he become symptomatic in the future but will not prescribe antibiotics  and would not recommend she receive antibiotics in the future unless he is symptomatic. - 2017 Other lack of coordination, Muscular incoordination - 2016 Other muscle spasm, Muscle spasm - 2016 Encounter for general adult medical examination without abnormal findings, Encounter for preventive health examination - 2015 Personal history of other diseases of the circulatory system, History of hypertension - 2014 Personal history of other endocrine, nutritional and metabolic disease, History of hypercholesterolemia - 2014    FAMILY HISTORY: Congestive Heart Failure - Runs In Family Death of family member - Mother, Father Avalon is 1 daughter and 1 - Runs In [place] _4__ Living Daughter - Runs In [place] In Family Lung Cancer - Mother, Runs In Family No pertinent family history - Other   SOCIAL HISTORY: Marital Status: Married Preferred Language: English; Ethnicity: Not Hispanic Or Latino; Race: White Current Smoking Status: Patient does not smoke anymore.  Does not use smokeless tobacco. Has never drank.  Does not use drugs. Drinks 1  caffeinated drink per day. Has not had a blood transfusion.     Notes: Quit smoking 60 years ago    REVIEW OF SYSTEMS:    GU Review Male:   Patient reports frequent urination, get up at night to urinate, leakage of urine, stream starts and stops, trouble starting your stream, and have to strain to urinate . Patient denies hard to postpone urination, burning/ pain with urination, erection problems, and penile pain.  Gastrointestinal (Upper):   Patient denies nausea, vomiting, and indigestion/ heartburn.  Gastrointestinal (Lower):   Patient denies diarrhea and constipation.  Constitutional:   Patient denies fever, night sweats, weight loss, and fatigue.  Skin:   Patient denies skin rash/ lesion and itching.  Eyes:   Patient denies blurred vision and double vision.  Ears/ Nose/ Throat:   Patient denies sore throat and sinus problems.  Hematologic/Lymphatic:   Patient denies swollen glands and easy bruising.  Cardiovascular:   Patient denies leg swelling and chest pains.  Respiratory:   Patient denies cough and shortness of breath.  Endocrine:   Patient denies excessive thirst.  Musculoskeletal:   Patient denies back pain and joint pain.  Neurological:   Patient denies headaches and dizziness.  Psychologic:   Patient denies depression and anxiety.   Notes: Updated from previous visit 03/01/2021 with review from patient as noted above.   VITAL SIGNS:      11/10/2021 11:25 AM  BP 155/80 mmHg  Pulse 74 /min  Temperature 98.2 F / 36.7 C   MULTI-SYSTEM PHYSICAL EXAMINATION:    Constitutional: Well-nourished. No physical deformities. Normally developed. Good grooming.  Neck: Neck symmetrical, not swollen. Normal tracheal position.  Respiratory: No labored breathing, no use of accessory muscles.   Cardiovascular: Normal temperature, normal extremity pulses, no swelling, no varicosities.  Skin: No paleness, no jaundice, no cyanosis. No lesion, no ulcer, no rash.  Neurologic / Psychiatric:  Oriented to time, oriented to place, oriented to person. No depression, no anxiety, no agitation.  Gastrointestinal: No mass, no tenderness, no rigidity, non obese abdomen.  Musculoskeletal: Normal gait and station of head and neck.     Complexity of Data:  Source Of History:  Patient, Medical Record Summary  Lab Test Review:   PSA  Records Review:   Pathology Reports, Previous Doctor Records, Previous Hospital Records, Previous Patient Records  Urine Test Review:   Urinalysis, Urine Culture  Urodynamics Review:   Review Bladder Scan  X-Ray Review:  PET Scan: Reviewed Report.  C.T. Abdomen/Pelvis: Reviewed Films. Reviewed Report.     10/11/21 12/13/20 06/07/20 12/10/19 05/14/19 01/16/19 10/01/18 07/05/18  PSA  Total PSA 6.43 ng/mL 10.50 ng/mL 11.50 ng/mL 9.71 ng/mL 10.50 ng/mL 9.21 ng/mL 8.24 ng/mL 7.09 ng/mL  Free PSA    1.99 ng/mL  2.14 ng/mL 1.89 ng/mL 1.71 ng/mL  % Free PSA    20 % PSA  23 % PSA 23 % PSA 24 % PSA    PROCEDURES:          Urinalysis w/Scope Dipstick Dipstick Cont'd Micro  Color: Yellow Bilirubin: Neg mg/dL WBC/hpf: 20 - 40/hpf  Appearance: Cloudy Ketones: Neg mg/dL RBC/hpf: 20 - 40/hpf  Specific Gravity: 1.020 Blood: 3+ ery/uL Bacteria: Mod (26-50/hpf)  pH: 6.0 Protein: Neg mg/dL Cystals: NS (Not Seen)  Glucose: Neg mg/dL Urobilinogen: 0.2 mg/dL Casts: NS (Not Seen)    Nitrites: Neg Trichomonas: Not Present    Leukocyte Esterase: 2+ leu/uL Mucous: Present      Epithelial Cells: 0 - 5/hpf      Yeast: NS (Not Seen)      Sperm: Not Present    ASSESSMENT:      ICD-10 Details  1 GU:   BPH w/LUTS - N40.1 Chronic, Stable  2   Urinary Frequency - R35.0 Chronic, Stable  3   Incomplete bladder emptying - R39.14 Chronic, Stable   PLAN:            Medications Stop Meds: Finasteride 5 mg tablet 1 tablet PO Daily  Start: 12/30/2020  Stop: 12/30/2021   Discontinue: 11/10/2021  - Reason: The patient suffered unacceptable side effects.            Orders Labs  Urine Culture          Schedule Return Visit/Planned Activity: Keep Scheduled Appointment - Follow up MD, Schedule Surgery          Document Letter(s):  Created for Patient: Clinical Summary         Notes:   All questions answered to the best of my ability regarding the upcoming procedure and expected postoperative course with understanding expressed by the patient. Urine culture sent today to serve as a baseline. He will proceed with previously scheduled TURP and cystolitholopaxy with Dr. Abner Greenspan on 7/31.        Next Appointment:      Next Appointment: 11/21/2021 12:45 PM    Appointment Type: Surgery     Location: [facility] [phone]    Provider: Rexene Alberts, M.D.    Reason for Visit: [place]    Urology Preoperative H&P   Chief Complaint: BPH with LUTS and bladder stones  History of Present Illness: Edwin Levine is a 71 y.o. male with BPH with LUTS and bladder stones here for TURP with cystolitholopaxy.  He denies fevers, chills, dysuria.    Past Medical History:  Diagnosis Date   Arthritis    Cancer ([place])    right kidney   Depression    GERD (gastroesophageal reflux disease)    Hyperlipidemia    Hypertension    Palpitations    Pre-diabetes    SVT (supraventricular tachycardia) ([place])     Past Surgical History:  Procedure Laterality Date   ELECTROPHYSIOLOGIC STUDY N/A 02/16/2016   Procedure: SVT Ablation;  Surgeon: Evans Lance, MD;  Location: [facility];  Service: Cardiovascular;  Laterality: N/A;   HERNIA REPAIR     ROBOT ASSISTED LAPAROSCOPIC NEPHRECTOMY Right  03/21/2021   Procedure: ATTEMPTED XI ROBOTIC ASSISTED LAPAROSCOPIC RADICAL NEPHRECTOMY, CONVERTED TO RIGHT OPEN NEPHRECTOMY;  Surgeon: Janith Lima, MD;  Location: [facility];  Service: Urology;  Laterality: Right;    Allergies: No Known Allergies  Family History  Problem Relation Age of Onset   Heart Problems Mother    Aneurysm Father    Diabetes  Neg Hx    Cancer Neg Hx    Stroke Neg Hx     Social History:  reports that he has never smoked. He has never used smokeless tobacco. He reports that he does not drink alcohol and does not use drugs.  ROS: A complete review of systems was performed.  All systems are negative except for pertinent findings as noted.  Physical Exam:  Vital signs in last 24 hours:   Constitutional:  Alert and oriented, No acute distress Cardiovascular: Regular rate and rhythm Respiratory: Normal respiratory effort, Lungs clear bilaterally GI: Abdomen is soft, nontender, nondistended, no abdominal masses GU: No CVA tenderness Lymphatic: No lymphadenopathy Neurologic: Grossly intact, no focal deficits Psychiatric: Normal mood and affect  Laboratory Data:  No results for input(s): "WBC", "HGB", "HCT", "PLT" in the last 72 hours.  No results for input(s): "NA", "K", "CL", "GLUCOSE", "BUN", "CALCIUM", "CREATININE" in the last 72 hours.  Invalid input(s): "CO3"   No results found for this or any previous visit (from the past 24 hour(s)). No results found for this or any previous visit (from the past 240 hour(s)).  Renal Function: Recent Labs    11/15/21 0909  CREATININE 1.20   Estimated Creatinine Clearance: 63.9 mL/min (by C-G formula based on SCr of 1.2 mg/dL).  Radiologic Imaging: No results found.  I independently reviewed the above imaging studies.  Assessment and Plan Edwin Levine is a 71 y.o. male with BPH with LUTS and bladder stones here for TURP with cystolitholopaxy.   Risks and benefits of Transurethral Resection of the Prostate were reviewed in detail including infection, bleeding, blood transfusion, injury to bladder/urethra/surrounding structures, erectile dysfunction, urinary incontinence, bladder neck contracture, persistent obstructive and irritative voiding symptoms, and global anesthesia risks including but not limited to CVA, MI, DVT, PE, pneumonia, and death. He  expressed understanding and desire to proceed.    Matt R. Kathan Kirker MD 11/21/2021, 1:26 AM  [facility] Pager: [phone]): [phone]

## 2021-11-21 NOTE — Anesthesia Preprocedure Evaluation (Addendum)
Anesthesia Evaluation  Patient identified by MRN, date of birth, ID band Patient awake    Reviewed: Allergy & Precautions, NPO status , Patient's Chart, lab work & pertinent test results  Airway Mallampati: II  TM Distance: >3 FB Neck ROM: Full    Dental no notable dental hx. (+) Teeth Intact, Dental Advisory Given   Pulmonary neg pulmonary ROS,    Pulmonary exam normal breath sounds clear to auscultation       Cardiovascular hypertension, Pt. on medications Normal cardiovascular exam Rhythm:Regular Rate:Normal  EF 50-55%   Neuro/Psych negative neurological ROS     GI/Hepatic GERD  ,  Endo/Other    Renal/GU Renal diseaseHx of RCC ? Mets  Lab Results      Component                Value               Date                      CREATININE               1.20                11/15/2021                BUN                      16                  11/15/2021                     K                        3.6                 11/15/2021                    Musculoskeletal  (+) Arthritis ,   Abdominal   Peds  Hematology   Anesthesia Other Findings   Reproductive/Obstetrics                            Anesthesia Physical Anesthesia Plan  ASA: 3  Anesthesia Plan: General   Post-op Pain Management:    Induction: Intravenous  PONV Risk Score and Plan: 3 and Treatment may vary due to age or medical condition, Ondansetron and Midazolam  Airway Management Planned: LMA  Additional Equipment:   Intra-op Plan:   Post-operative Plan:   Informed Consent: I have reviewed the patients History and Physical, chart, labs and discussed the procedure including the risks, benefits and alternatives for the proposed anesthesia with the patient or authorized representative who has indicated his/her understanding and acceptance.     Dental advisory given  Plan Discussed with:   Anesthesia Plan Comments:          Anesthesia Quick Evaluation

## 2021-11-21 NOTE — Discharge Instructions (Addendum)
Activity:  You are encouraged to ambulate frequently (about every hour during waking hours) to help prevent blood clots from forming in your legs or lungs.    Diet: You should advance your diet as instructed by your physician.  It will be normal to have some bloating, nausea, and abdominal discomfort intermittently.  Prescriptions:  You will be provided a prescription for pain medication to take as needed.  If your pain is not severe enough to require the prescription pain medication, you may take extra strength Tylenol instead which will have less side effects.  You should also take a prescribed stool softener to avoid straining with bowel movements as the prescription pain medication may constipate you.  What to call us about: You should call the office [phone]) if you develop fever > 101 or develop persistent vomiting. Activity:  You are encouraged to ambulate frequently (about every hour during waking hours) to help prevent blood clots from forming in your legs or lungs.    You have a foley catheter in place. Return to clinic in 7 days for foley removal.

## 2021-11-21 NOTE — Anesthesia Procedure Notes (Signed)
Procedure Name: LMA Insertion Date/Time: 11/21/2021 12:48 PM  Performed by: Genelle Bal, CRNAPre-anesthesia Checklist: Patient identified, Emergency Drugs available, Suction available and Patient being monitored Patient Re-evaluated:Patient Re-evaluated prior to induction Oxygen Delivery Method: Circle system utilized Preoxygenation: Pre-oxygenation with 100% oxygen Induction Type: IV induction Ventilation: Mask ventilation without difficulty LMA: LMA inserted LMA Size: 5.0 Number of attempts: 1 Airway Equipment and Method: Bite block Placement Confirmation: positive ETCO2 Tube secured with: Tape Dental Injury: Teeth and Oropharynx as per pre-operative assessment

## 2021-11-21 NOTE — Transfer of Care (Signed)
Immediate Anesthesia Transfer of Care Note  Patient: Edwin Levine  Procedure(s) Performed: TRANSURETHRAL RESECTION OF THE PROSTATE (TURP) CYSTOSCOPY WITH LITHOLAPAXY  Patient Location: PACU  Anesthesia Type:General  Level of Consciousness: drowsy and patient cooperative  Airway & Oxygen Therapy: Patient Spontanous Breathing and Patient connected to face mask oxygen  Post-op Assessment: Report given to RN and Post -op Vital signs reviewed and stable  Post vital signs: Reviewed and stable  Last Vitals:  Vitals Value Taken Time  BP 122/66 11/21/21 1500  Temp    Pulse 75 11/21/21 1501  Resp 11 11/21/21 1501  SpO2 100 % 11/21/21 1501  Vitals shown include unvalidated device data.  Last Pain:  Vitals:   11/21/21 1050  TempSrc: Oral  PainSc:          Complications: No notable events documented.

## 2021-11-21 NOTE — Op Note (Addendum)
Operative Note  Preoperative diagnosis:  1.  BPH with bladder outlet obstruction 2. Bladder stones  Postoperative diagnosis: 1.  BPH with bladder outlet obstruction 2. Bladder stones  Procedure(s): 1.  Bipolar transurethral resection of prostate of median and left lobe 2. Cystolitholapaxy of bladder stones 4cm  Surgeon: Rexene Alberts, MD  Assistants:  None  Anesthesia:  General  Complications:  None  EBL:  Minimal  Specimens: 1. Prostate chips ID Type Source Tests Collected by Time Destination  1 : Prostate Chips Tissue PATH GU tumor resection SURGICAL PATHOLOGY Janith Lima, MD 11/21/2021 1439    Drains/Catheters: 1.  22Fr 3 way catheter with 58m water into balloon  Intraoperative findings:   2 separate bladder stones each about 2 x 2 cm each successfully fragmented and evacuated. Elongated prostatic urethra with trilobar obstructing prostate with intravesical protrusion into the bladder. Excellent resection of the median and left lateral lobe. Given OR time greater than 2 hours, I elected not to initiate resection of his right lobe.  Indication:  Edwin DUGDALEis a 71y.o. male with BPH with bladder outlet obstruction presenting for transurethral resection of the prostate. He had a TRUS volume of 105cc.  He has been performing clean remittent catheterization as he has been in urinary retention. After thorough discussion including all relevant risk benefits and alternatives, he presents today for a bipolar TURP.  Description of procedure: The indication, alternatives, benefits and risks were discussed with the patient and informed consent was obtained.  Patient was brought to the operating room table, positioned supine, secured with a safety strap.  Pneumatic compression devices were placed on the lower extremities.  After the administration of intravenous antibiotics and general anesthesia, the patient was repositioned into the dorsal lithotomy position.  All pressure  points were carefully padded.  A rectal examination was performed confirming a smooth symmetric enlarged gland.  The genitalia were prepped and draped in standard sterile manner.  A timeout was completed, verifying the correct patient, surgical procedure and positioning prior to beginning the procedure.  Isotonic sodium chloride was used for irrigation.  A 21 French continuous-flow laser bridge with sheath with the visual obturator and a 30 degree lens was advanced under direct vision into the bladder.  The anterior urethra appeared normal. He did have a wide caliber bulbar annular ring and the scope was able to bypass this. The prostatic urethra was elongated with trilobar hyperplasia.  On cystoscopic evaluation, his bladder capacity appeared normal, the bladder wall was noted to expand symmetrically in all dimensions.  There were no tumors, stones or foreign bodies present. The bladder was trabeculated with normal-appearing mucosa.  Both ureteral orifices were in their normal anatomic positions.  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  I Identified 2 separate bladder stones measuring each 2 cm.  Using a 900 m holmium laser fiber, the stones were fragmented and evacuated. After fragmenting the stone, there was some irritation in the trigone from prior fragmentation of the stone. There was no evidence of perforation. I did use a resectoscope loop and fulgurate in this area. I was able to see muscle however nothing beyond. There was no evidence of perforation. The in/out remained equivalent throughout the entirety of the case.  The obturator was removed and replaced by the working element with a resection loop.  The location of the ureteral orifices and the prostatic configuration were again confirmed.  Starting at the bladder neck and proceeding distally to the verumontanum a  transurethral section of the prostate was performed using bipolar using energy of 4 and 5 for cutting and coagulation,  respectively.  The procedure began at the bladder neck at the 5 o'clock and 7 o'clock positions and carefully carried distally to the verumontanum, resecting the intervening prostatic adenoma.  Next the left lateral lobe was resected to the level of the transverse capsular fibers.  After his median and left lateral lobe were resected, there is no more obstruction. At the left apex, there was a slight patent venous sinus with separation of the capsule. This was fulgurated and hemostasis controlled. There was no large capsular perforation. Given the prolonged operating time greater than 2 hours, I elected not to initiate resection of his right lobe as I wanted to avoid risk of TUR syndroome.  I did resect the anterior aspect of the prostate.  All bleeding vessels were fulgurated achieving meticulous hemostasis.  The bladder was irrigated with a Toomey syringe, ensuring removal of all prostate chips which were sent to pathology for evaluation.  Having completed the resection and the chips removed, we again confirmed hemostasis with the loop with coagulating current.  Upon completion of the entire procedure, the bladder and posterior urethra were reexamined, confirming open prostatic urethra and bladder neck without evidence of bleeding or perforation.  Both ureteral orifices and the external sphincter were noted to be intact. He had a wide open left prostatic fossa defect and was unobstructed.  The resectoscope was withdrawn under direct vision and a 22 French three-way Foley catheter with a 30 cc balloon was inserted into the bladder.  The balloon was inflated with 50 cc of sterile water. After multiple manual irrigations ensuring light pink return of the irrigant, the procedure was terminated.  The catheter was attached to a drainage bag and continuous bladder irrigation was started with normal saline.  The patient was positioned supine.  At the end of the procedure, all counts were correct.  Patient  tolerated the procedure well and was taken to the recovery room satisfactory condition.  Plan: Continuous bladder irrigation overnight.  Plan to discharge home tomorrow with Foley catheter in place and void trial in the office in 5-7 days.  Matt R. [facility]  Pager: [phone]

## 2021-11-22 ENCOUNTER — Encounter (HOSPITAL_COMMUNITY): Payer: Self-pay | Admitting: Urology

## 2021-11-22 DIAGNOSIS — N401 Enlarged prostate with lower urinary tract symptoms: Secondary | ICD-10-CM | POA: Diagnosis not present

## 2021-11-22 DIAGNOSIS — N210 Calculus in bladder: Secondary | ICD-10-CM | POA: Diagnosis not present

## 2021-11-22 DIAGNOSIS — Z905 Acquired absence of kidney: Secondary | ICD-10-CM | POA: Diagnosis not present

## 2021-11-22 DIAGNOSIS — Z85528 Personal history of other malignant neoplasm of kidney: Secondary | ICD-10-CM | POA: Diagnosis not present

## 2021-11-22 DIAGNOSIS — R3914 Feeling of incomplete bladder emptying: Secondary | ICD-10-CM | POA: Diagnosis not present

## 2021-11-22 DIAGNOSIS — I10 Essential (primary) hypertension: Secondary | ICD-10-CM | POA: Diagnosis not present

## 2021-11-22 DIAGNOSIS — Z79899 Other long term (current) drug therapy: Secondary | ICD-10-CM | POA: Diagnosis not present

## 2021-11-22 DIAGNOSIS — N138 Other obstructive and reflux uropathy: Secondary | ICD-10-CM | POA: Diagnosis not present

## 2021-11-22 DIAGNOSIS — R339 Retention of urine, unspecified: Secondary | ICD-10-CM | POA: Diagnosis not present

## 2021-11-22 DIAGNOSIS — R338 Other retention of urine: Secondary | ICD-10-CM | POA: Diagnosis not present

## 2021-11-22 DIAGNOSIS — N3289 Other specified disorders of bladder: Secondary | ICD-10-CM | POA: Diagnosis not present

## 2021-11-22 LAB — BASIC METABOLIC PANEL
Anion gap: 10 (ref 5–15)
Anion gap: 8 (ref 5–15)
BUN: 26 mg/dL — ABNORMAL HIGH (ref 8–23)
BUN: 24 mg/dL — ABNORMAL HIGH (ref 8–23)
CO2: 24 mmol/L (ref 22–32)
CO2: 25 mmol/L (ref 22–32)
Calcium: 8.8 mg/dL — ABNORMAL LOW (ref 8.9–10.3)
Calcium: 8.7 mg/dL — ABNORMAL LOW (ref 8.9–10.3)
Chloride: 99 mmol/L (ref 98–111)
Chloride: 105 mmol/L (ref 98–111)
Creatinine, Ser: 1.29 mg/dL — ABNORMAL HIGH (ref 0.61–1.24)
Creatinine, Ser: 1.34 mg/dL — ABNORMAL HIGH (ref 0.61–1.24)
GFR, Estimated: >60 mL/min (ref >60)
GFR, Estimated: 57 mL/min — ABNORMAL LOW (ref >60)
Glucose, Bld: 213 mg/dL — ABNORMAL HIGH (ref 70–99)
Glucose, Bld: 197 mg/dL — ABNORMAL HIGH (ref 70–99)
Potassium: 3.5 mmol/L (ref 3.5–5.1)
Potassium: 3.7 mmol/L (ref 3.5–5.1)
Sodium: 133 mmol/L — ABNORMAL LOW (ref 135–145)
Sodium: 138 mmol/L (ref 135–145)

## 2021-11-22 LAB — CBC
HCT: 34.5 % — ABNORMAL LOW (ref 39.0–52.0)
Hemoglobin: 11.7 g/dL — ABNORMAL LOW (ref 13.0–17.0)
MCH: 32.2 pg (ref 26.0–34.0)
MCHC: 33.9 g/dL (ref 30.0–36.0)
MCV: 95 fL (ref 80.0–100.0)
Platelets: 222 10*3/uL (ref 150–400)
RBC: 3.63 MIL/uL — ABNORMAL LOW (ref 4.22–5.81)
RDW: 11.8 % (ref 11.5–15.5)
WBC: 16.6 10*3/uL — ABNORMAL HIGH (ref 4.0–10.5)
nRBC: 0 % (ref 0.0–0.2)

## 2021-11-22 LAB — SURGICAL PATHOLOGY

## 2021-11-22 NOTE — Anesthesia Postprocedure Evaluation (Signed)
Anesthesia Post Note  Patient: Edwin Levine  Procedure(s) Performed: TRANSURETHRAL RESECTION OF THE PROSTATE (TURP) CYSTOSCOPY WITH LITHOLAPAXY     Patient location during evaluation: PACU Anesthesia Type: General Level of consciousness: awake and alert Pain management: pain level controlled Vital Signs Assessment: post-procedure vital signs reviewed and stable Respiratory status: spontaneous breathing, nonlabored ventilation, respiratory function stable and patient connected to nasal cannula oxygen Cardiovascular status: blood pressure returned to baseline and stable Postop Assessment: no apparent nausea or vomiting Anesthetic complications: no   No notable events documented.  Last Vitals:  Vitals:   11/22/21 0009 11/22/21 0413  BP: 111/62 138/66  Pulse: 87 82  Resp: 18 18  Temp: 36.7 C 36.5 C  SpO2: 96% 96%    Last Pain:  Vitals:   11/22/21 0413  TempSrc: Oral  PainSc:                  Sergei Delo S

## 2021-11-22 NOTE — Progress Notes (Signed)
   11/22/21 1100  Mobility  Level of Assistance Independent after set-up  Assistive Device None  Distance Ambulated (ft) 450 ft  Activity Response Tolerated well  $Mobility charge 1 Mobility   Pt ambulation was very well. No unsteadiness, only assisted with gown.   Roderick Pee  Mobility Specialist

## 2021-11-22 NOTE — Progress Notes (Signed)
  Transition of Care [facility]) Screening Note   Patient Details  Name: Edwin Levine Date of Birth: Nov 25, 1950   Transition of Care [facility]) CM/SW Contact:    Dessa Phi, RN Phone Number: 11/22/2021, 11:54 AM    Transition of Care Department [facility]) has reviewed patient and no TOC needs have been identified at this time. We will continue to monitor patient advancement through interdisciplinary progression rounds. If new patient transition needs arise, please place a TOC consult.

## 2021-11-22 NOTE — Discharge Summary (Signed)
Date of admission: 11/21/2021  Date of discharge: 11/22/2021  Admission diagnosis: BPH  Discharge diagnosis: BPH  Secondary diagnoses: Bladder stones  History and Physical: For full details, please see admission history and physical. Briefly, Edwin Levine is a 71 y.o. year old patient with BPH and bladder stones who underwent TURP and cystolitholapaxy.   Hospital Course: The patient recovered in the usual expected fashion.  He had his diet advanced slowly.  Initially managed with IV pain control, then transitioned to PO meds when he was tolerating oral intake.  His labs were stable throughout the hospital course.  He was discharged to home on POD#1.  At the time of discharge the patient was tolerating a regular diet, passing flatus, ambulating, had adequate pain control and was agreeable to discharge.  Follow up as scheduled.    Laboratory values:  Recent Labs    11/21/21 1216 11/21/21 1627 11/22/21 0347  HGB 13.3 12.8* 11.7*  HCT 39.0 36.5* 34.5*   Recent Labs    11/22/21 0347 11/22/21 0951  CREATININE 1.29* 1.34*    Disposition: Home  Discharge instruction: The patient was instructed to be ambulatory but told to refrain from heavy lifting, strenuous activity, or driving.   Discharge medications:  Allergies as of 11/22/2021   No Known Allergies      Medication List     TAKE these medications    acetaminophen 325 MG tablet Commonly known as: TYLENOL Take 650 mg by mouth every 6 (six) hours as needed for mild pain.   alendronate 70 MG tablet Commonly known as: FOSAMAX Take 70 mg by mouth once a week.   atorvastatin 40 MG tablet Commonly known as: LIPITOR Take 40 mg by mouth daily.   BENGAY EX Apply 1 application  topically daily as needed (joint pain).   cephALEXin 500 MG capsule Commonly known as: KEFLEX Take 1 capsule (500 mg total) by mouth 2 (two) times daily for 10 doses.   Cinnamon 500 MG capsule Take 500 mg by mouth daily.   docusate sodium 100  MG capsule Commonly known as: Colace Take 1 capsule (100 mg total) by mouth daily as needed for up to 30 doses. What changed: You were already taking a medication with the same name, and this prescription was added. Make sure you understand how and when to take each.   docusate sodium 100 MG capsule Commonly known as: Colace Take 1 capsule (100 mg total) by mouth daily as needed for up to 30 doses. What changed: Another medication with the same name was added. Make sure you understand how and when to take each.   escitalopram 5 MG tablet Commonly known as: LEXAPRO Take 5 mg by mouth daily.   lisinopril-hydrochlorothiazide 20-25 MG tablet Commonly known as: ZESTORETIC Take 1 tablet by mouth daily.   multivitamin with minerals Tabs tablet Take 1 tablet by mouth daily.   oxyCODONE-acetaminophen 5-325 MG tablet Commonly known as: Percocet Take 1 tablet by mouth every 4 hours as needed for severe pain.   tamsulosin 0.4 MG Caps capsule Commonly known as: FLOMAX Take 0.4 mg by mouth daily.   VITAMIN B-12 PO Take 1 tablet by mouth daily.        Followup:   Follow-up Information     [facility]. Schedule an appointment as soon as possible for a visit.   Why: 10AM Contact information: [place] [phone]  Matt R. [facility]  Pager: [phone]

## 2021-11-29 DIAGNOSIS — G4700 Insomnia, unspecified: Secondary | ICD-10-CM | POA: Diagnosis not present

## 2021-12-06 DIAGNOSIS — E559 Vitamin D deficiency, unspecified: Secondary | ICD-10-CM | POA: Diagnosis not present

## 2021-12-06 DIAGNOSIS — M810 Age-related osteoporosis without current pathological fracture: Secondary | ICD-10-CM | POA: Diagnosis not present

## 2021-12-06 DIAGNOSIS — Z87440 Personal history of urinary (tract) infections: Secondary | ICD-10-CM | POA: Diagnosis not present

## 2021-12-06 DIAGNOSIS — R7303 Prediabetes: Secondary | ICD-10-CM | POA: Diagnosis not present

## 2021-12-06 DIAGNOSIS — E782 Mixed hyperlipidemia: Secondary | ICD-10-CM | POA: Diagnosis not present

## 2021-12-06 DIAGNOSIS — R0683 Snoring: Secondary | ICD-10-CM | POA: Diagnosis not present

## 2021-12-06 DIAGNOSIS — G4700 Insomnia, unspecified: Secondary | ICD-10-CM | POA: Diagnosis not present

## 2021-12-06 DIAGNOSIS — I10 Essential (primary) hypertension: Secondary | ICD-10-CM | POA: Diagnosis not present

## 2021-12-20 DIAGNOSIS — I10 Essential (primary) hypertension: Secondary | ICD-10-CM | POA: Diagnosis not present

## 2021-12-20 DIAGNOSIS — R0683 Snoring: Secondary | ICD-10-CM | POA: Diagnosis not present

## 2021-12-27 ENCOUNTER — Telehealth: Payer: Self-pay

## 2021-12-27 NOTE — Patient Outreach (Signed)
  Care Coordination   12/27/2021 Name: Edwin Levine MRN: [id number] DOB: 1950-07-23   Care Coordination Outreach Attempts:  An unsuccessful telephone outreach was attempted today to offer the patient information about available care coordination services as a benefit of their health plan.   Follow Up Plan:  Additional outreach attempts will be made to offer the patient care coordination information and services.   Encounter Outcome:  No Answer   Care Coordination Interventions Activated:  No   Care Coordination Interventions:  No, not indicated    Jone Baseman, RN, MSN [facility] Care Management Care Management Coordinator Direct Line [phone] Toll Free: [phone]  Fax: [phone]

## 2022-01-02 ENCOUNTER — Telehealth: Payer: Self-pay

## 2022-01-02 DIAGNOSIS — I700 Atherosclerosis of aorta: Secondary | ICD-10-CM | POA: Diagnosis not present

## 2022-01-02 DIAGNOSIS — C641 Malignant neoplasm of right kidney, except renal pelvis: Secondary | ICD-10-CM | POA: Diagnosis not present

## 2022-01-02 DIAGNOSIS — D49511 Neoplasm of unspecified behavior of right kidney: Secondary | ICD-10-CM | POA: Diagnosis not present

## 2022-01-02 DIAGNOSIS — J8410 Pulmonary fibrosis, unspecified: Secondary | ICD-10-CM | POA: Diagnosis not present

## 2022-01-02 DIAGNOSIS — J984 Other disorders of lung: Secondary | ICD-10-CM | POA: Diagnosis not present

## 2022-01-02 DIAGNOSIS — K449 Diaphragmatic hernia without obstruction or gangrene: Secondary | ICD-10-CM | POA: Diagnosis not present

## 2022-01-02 DIAGNOSIS — K8020 Calculus of gallbladder without cholecystitis without obstruction: Secondary | ICD-10-CM | POA: Diagnosis not present

## 2022-01-02 NOTE — Patient Outreach (Signed)
  Care Coordination   01/02/2022 Name: ERIQ HUFFORD MRN: [id number] DOB: 12/14/1950   Care Coordination Outreach Attempts:  A second unsuccessful outreach was attempted today to offer the patient with information about available care coordination services as a benefit of their health plan.     Follow Up Plan:  Additional outreach attempts will be made to offer the patient care coordination information and services.   Encounter Outcome:  No Answer  Care Coordination Interventions Activated:  No   Care Coordination Interventions:  No, not indicated    Jone Baseman, RN, MSN [facility] Care Management Care Management Coordinator Direct Line [phone] Toll Free: [phone]  Fax: [phone]

## 2022-01-10 ENCOUNTER — Telehealth: Payer: Self-pay

## 2022-01-10 DIAGNOSIS — Z23 Encounter for immunization: Secondary | ICD-10-CM | POA: Diagnosis not present

## 2022-01-10 DIAGNOSIS — R3914 Feeling of incomplete bladder emptying: Secondary | ICD-10-CM | POA: Diagnosis not present

## 2022-01-10 DIAGNOSIS — D49511 Neoplasm of unspecified behavior of right kidney: Secondary | ICD-10-CM | POA: Diagnosis not present

## 2022-01-10 DIAGNOSIS — E041 Nontoxic single thyroid nodule: Secondary | ICD-10-CM | POA: Diagnosis not present

## 2022-01-10 DIAGNOSIS — M810 Age-related osteoporosis without current pathological fracture: Secondary | ICD-10-CM | POA: Diagnosis not present

## 2022-01-10 DIAGNOSIS — G4700 Insomnia, unspecified: Secondary | ICD-10-CM | POA: Diagnosis not present

## 2022-01-10 DIAGNOSIS — E1169 Type 2 diabetes mellitus with other specified complication: Secondary | ICD-10-CM | POA: Diagnosis not present

## 2022-01-10 DIAGNOSIS — E782 Mixed hyperlipidemia: Secondary | ICD-10-CM | POA: Diagnosis not present

## 2022-01-10 DIAGNOSIS — Z0000 Encounter for general adult medical examination without abnormal findings: Secondary | ICD-10-CM | POA: Diagnosis not present

## 2022-01-10 DIAGNOSIS — E559 Vitamin D deficiency, unspecified: Secondary | ICD-10-CM | POA: Diagnosis not present

## 2022-01-10 DIAGNOSIS — I10 Essential (primary) hypertension: Secondary | ICD-10-CM | POA: Diagnosis not present

## 2022-01-10 DIAGNOSIS — I700 Atherosclerosis of aorta: Secondary | ICD-10-CM | POA: Diagnosis not present

## 2022-01-10 NOTE — Patient Instructions (Signed)
Visit Information  Thank you for taking time to visit with me today. Please don't hesitate to contact me if I can be of assistance to you.   Following are the goals we discussed today:   Goals Addressed             This Visit's Progress    COMPLETED: Care Coordination Activites-No follow up required       Care Coordination Interventions: Advised patient to schedule annual wellness visit. Patient has that appointment today.          If you are experiencing a Mental Health or [place] or need someone to talk to, please call the Suicide and Crisis Lifeline: 988   Patient verbalizes understanding of instructions and care plan provided today and agrees to view in [place]. Active MyChart status and patient understanding of how to access instructions and care plan via MyChart confirmed with patient.     No further follow up required: patient declined    Jone Baseman, RN, MSN [place] [facility] Care Management Coordinator Direct Line [phone]

## 2022-01-10 NOTE — Patient Outreach (Signed)
  Care Coordination   Initial Visit Note   01/10/2022 Name: Edwin Levine MRN: [id number] DOB: 22-Feb-1951  Edwin Levine is a 71 y.o. year old male who sees Edwin Contras, MD for primary care. I spoke with  Edwin Levine by phone today.  What matters to the patients health and wellness today?  none    Goals Addressed             This Visit's Progress    COMPLETED: Care Coordination Activites-No follow up required       Care Coordination Interventions: Advised patient to schedule annual wellness visit. Patient has that appointment today.          SDOH assessments and interventions completed:  Yes     Care Coordination Interventions Activated:  Yes  Care Coordination Interventions:  Yes, provided   Follow up plan: No further intervention required.   Encounter Outcome:  Pt. Visit Completed   Jone Baseman, RN, MSN [place] Management Care Management Coordinator Direct Line [phone]

## 2022-01-11 ENCOUNTER — Ambulatory Visit
Admission: RE | Admit: 2022-01-11 | Discharge: 2022-01-11 | Disposition: A | Discharge status: Home / Self Care | Payer: Medicare Other | Source: Ambulatory Visit | Attending: Family Medicine | Admitting: Family Medicine

## 2022-01-11 ENCOUNTER — Other Ambulatory Visit: Payer: Self-pay | Admitting: Family Medicine

## 2022-01-11 DIAGNOSIS — E042 Nontoxic multinodular goiter: Secondary | ICD-10-CM | POA: Diagnosis not present

## 2022-01-11 DIAGNOSIS — E041 Nontoxic single thyroid nodule: Secondary | ICD-10-CM

## 2022-01-17 DIAGNOSIS — G4733 Obstructive sleep apnea (adult) (pediatric): Secondary | ICD-10-CM | POA: Diagnosis not present

## 2022-01-17 DIAGNOSIS — I10 Essential (primary) hypertension: Secondary | ICD-10-CM | POA: Diagnosis not present

## 2022-01-27 ENCOUNTER — Other Ambulatory Visit: Payer: Self-pay | Admitting: Family Medicine

## 2022-01-27 DIAGNOSIS — E041 Nontoxic single thyroid nodule: Secondary | ICD-10-CM

## 2022-02-06 ENCOUNTER — Other Ambulatory Visit (HOSPITAL_COMMUNITY)
Admission: RE | Admit: 2022-02-06 | Discharge: 2022-02-06 | Disposition: A | Discharge status: Home / Self Care | Payer: Medicare Other | Source: Ambulatory Visit | Attending: Family Medicine | Admitting: Family Medicine

## 2022-02-06 ENCOUNTER — Ambulatory Visit
Admission: RE | Admit: 2022-02-06 | Discharge: 2022-02-06 | Disposition: A | Discharge status: Home / Self Care | Payer: Medicare Other | Source: Ambulatory Visit | Attending: Family Medicine | Admitting: Family Medicine

## 2022-02-06 DIAGNOSIS — E041 Nontoxic single thyroid nodule: Secondary | ICD-10-CM

## 2022-02-07 DIAGNOSIS — G4733 Obstructive sleep apnea (adult) (pediatric): Secondary | ICD-10-CM | POA: Diagnosis not present

## 2022-02-08 LAB — CYTOLOGY - NON PAP

## 2022-03-10 DIAGNOSIS — G4733 Obstructive sleep apnea (adult) (pediatric): Secondary | ICD-10-CM | POA: Diagnosis not present

## 2022-03-22 ENCOUNTER — Encounter: Payer: Self-pay | Admitting: Orthopaedic Surgery

## 2022-03-22 ENCOUNTER — Ambulatory Visit (INDEPENDENT_AMBULATORY_CARE_PROVIDER_SITE_OTHER): Payer: Medicare Other | Admitting: Orthopaedic Surgery

## 2022-03-22 VITALS — BP 171/79 | HR 63 | Ht 68.5 in

## 2022-03-22 DIAGNOSIS — M65341 Trigger finger, right ring finger: Secondary | ICD-10-CM

## 2022-03-22 MED ORDER — METHYLPREDNISOLONE ACETATE 40 MG/ML IJ SUSP: 40.0000 mg | Freq: Once | INTRAMUSCULAR | Status: DC

## 2022-03-22 MED ORDER — METHYLPREDNISOLONE ACETATE 40 MG/ML IJ SUSP
40.0000 mg | Freq: Once | INTRAMUSCULAR | Status: AC
  Administered 2022-03-22: 40 mg via INTRA_ARTICULAR

## 2022-03-22 NOTE — Progress Notes (Signed)
Subjective:    Patient ID: Edwin Levine, male    DOB: June 05, 1950, 71 y.o.   MRN: [id number]  HPI He has triggering and locking of the right ring finger that is getting worse over the last week.  He has had it over two months.  He has no trauma, no numbness.  He awakens and the finger is locked and he has to use the other hand to unlock it.  It hurts.  He has no trauma.   Review of Systems  Constitutional:  Positive for activity change.  Cardiovascular:  Positive for palpitations.  Musculoskeletal:  Positive for arthralgias.  All other systems reviewed and are negative. For Review of Systems, all other systems reviewed and are negative.  The following is a summary of the past history medically, past history surgically, known current medicines, social history and family history.  This information is gathered electronically by the computer from prior information and documentation.  I review this each visit and have found including this information at this point in the chart is beneficial and informative.   Past Medical History:  Diagnosis Date   Arthritis    Cancer ([place])    right kidney   Depression    GERD (gastroesophageal reflux disease)    Hyperlipidemia    Hypertension    Palpitations    Pre-diabetes    SVT (supraventricular tachycardia)     Past Surgical History:  Procedure Laterality Date   CYSTOSCOPY WITH LITHOLAPAXY N/A 11/21/2021   Procedure: CYSTOSCOPY WITH LITHOLAPAXY;  Surgeon: Janith Lima, MD;  Location: [facility];  Service: Urology;  Laterality: N/A;   ELECTROPHYSIOLOGIC STUDY N/A 02/16/2016   Procedure: SVT Ablation;  Surgeon: Evans Lance, MD;  Location: [facility];  Service: Cardiovascular;  Laterality: N/A;   HERNIA REPAIR     ROBOT ASSISTED LAPAROSCOPIC NEPHRECTOMY Right 03/21/2021   Procedure: ATTEMPTED XI ROBOTIC ASSISTED LAPAROSCOPIC RADICAL NEPHRECTOMY, CONVERTED TO RIGHT OPEN NEPHRECTOMY;  Surgeon: Janith Lima, MD;  Location: [facility];   Service: Urology;  Laterality: Right;   TRANSURETHRAL RESECTION OF PROSTATE N/A 11/21/2021   Procedure: TRANSURETHRAL RESECTION OF THE PROSTATE (TURP);  Surgeon: Janith Lima, MD;  Location: [facility];  Service: Urology;  Laterality: N/A;  ONLY NEEDS 120 MIN FOR ALL PROCEDURES    Current Outpatient Medications on File Prior to Visit  Medication Sig Dispense Refill   acetaminophen (TYLENOL) 325 MG tablet Take 650 mg by mouth every 6 (six) hours as needed for mild pain.     atorvastatin (LIPITOR) 40 MG tablet Take 40 mg by mouth daily.     Cinnamon 500 MG capsule Take 500 mg by mouth daily.     Cyanocobalamin (VITAMIN B-12 PO) Take 1 tablet by mouth daily.     lisinopril-hydrochlorothiazide (ZESTORETIC) 20-25 MG tablet Take 1 tablet by mouth daily.      Menthol, Topical Analgesic, (BENGAY EX) Apply 1 application  topically daily as needed (joint pain).     Multiple Vitamin (MULTIVITAMIN WITH MINERALS) TABS tablet Take 1 tablet by mouth daily.     No current facility-administered medications on file prior to visit.    Social History   Socioeconomic History   Marital status: Married    Spouse name: Not on file   Number of children: Not on file   Years of education: Not on file   Highest education level: Not on file  Occupational History   Occupation: Drives [id number]    Employer: [employer]  Tobacco Use   Smoking status: Never   Smokeless tobacco: Never  Vaping Use   Vaping Use: Never used  Substance and Sexual Activity   Alcohol use: No   Drug use: No   Sexual activity: Not on file  Other Topics Concern   Not on file  Social History Narrative   Lives with his wife in [place], [place].   Social Determinants of Health   Financial Resource Strain: Not on file  Food Insecurity: Not on file  Transportation Needs: No Transportation Needs (01/10/2022)   PRAPARE - Hydrologist (Medical): No    Lack of Transportation (Non-Medical): No  Physical  Activity: Not on file  Stress: Not on file  Social Connections: Not on file  Intimate Partner Violence: Not on file    Family History  Problem Relation Age of Onset   Heart Problems Mother    Aneurysm Father    Diabetes Neg Hx    Cancer Neg Hx    Stroke Neg Hx     BP (!) 171/79   Pulse 63   Ht 5' 8.5" (1.74 m)   BMI 29.40 kg/m   Body mass index is 29.4 kg/m.      Objective:   Physical Exam Vitals and nursing note reviewed. Exam conducted with a chaperone present.  Constitutional:      Appearance: He is well-developed.  HENT:     Head: Normocephalic and atraumatic.  Eyes:     Conjunctiva/sclera: Conjunctivae normal.     Pupils: Pupils are equal, round, and reactive to light.  Cardiovascular:     Rate and Rhythm: Normal rate and regular rhythm.  Pulmonary:     Effort: Pulmonary effort is normal.  Abdominal:     Palpations: Abdomen is soft.  Musculoskeletal:       Hands:     Cervical back: Normal range of motion and neck supple.  Skin:    General: Skin is warm and dry.  Neurological:     Mental Status: He is alert and oriented to person, place, and time.     Cranial Nerves: No cranial nerve deficit.     Motor: No abnormal muscle tone.     Coordination: Coordination normal.     Deep Tendon Reflexes: Reflexes are normal and symmetric. Reflexes normal.  Psychiatric:        Behavior: Behavior normal.        Thought Content: Thought content normal.        Judgment: Judgment normal.           Assessment & Plan:   Encounter Diagnosis  Name Primary?   Trigger finger, right ring finger Yes   PROCEDURE  Trigger Finger Injection  The right Ring finger  has been locking at the A1 pulley.  The patient has been told about injection of the digit.  Surgical correction and excision of the A1 pulley will resolve the problem.  Ani injection in the digit should help but the results may be short lived.  The patient asked appropriate questions and understands the  procedure.  The patient has elected for an injection at this time.  Verbal consent was obtained.  A timeout was taken to confirm the proper hand and digit.  Medication  1 mL of DepoMedrol 40  2 mL of 1% lidocaine plain  Ethyl chloride for anesthesia  Alcohol was used to prepare the skin along with ethyl chloride and then the injection was made at the A1 pulley  there were no complications.  It was tolerated well.  A Band-aid dressing was applied.  Call if any problem or difficulty.  I will see in one week.  He has early triggering of the left long and ring fingers also but no locking.  Call if any problem.  Precautions discussed.  Electronically Signed Sanjuana Kava, MD 11/29/202311:15 AM

## 2022-03-22 NOTE — Addendum Note (Signed)
Addended by: Obie Dredge A on: 03/22/2022 11:19 AM   Modules accepted: Orders

## 2022-03-29 ENCOUNTER — Encounter: Payer: Self-pay | Admitting: Orthopaedic Surgery

## 2022-03-29 ENCOUNTER — Ambulatory Visit (INDEPENDENT_AMBULATORY_CARE_PROVIDER_SITE_OTHER): Payer: Medicare Other | Admitting: Orthopaedic Surgery

## 2022-03-29 VITALS — BP 158/82 | HR 69

## 2022-03-29 DIAGNOSIS — M65341 Trigger finger, right ring finger: Secondary | ICD-10-CM | POA: Diagnosis not present

## 2022-03-29 NOTE — Patient Instructions (Signed)
Think about scheduling an appointment with Dr.Harrison after the first of the year to discuss surgery to release that tendon.   You will be scheduled to see Dr.Harrison, who is in our office, to discuss your treatment options

## 2022-03-29 NOTE — Progress Notes (Signed)
My finger does not hurt but is still triggering.  He has triggering of the right ring finger but it is not hurting or locking.  NV intact.  Encounter Diagnosis  Name Primary?   Trigger finger, right ring finger Yes   I have recommended he see Dr. Aline Brochure for surgical release of the A1 pulley of the right ring finger.  Call if any problem.  Precautions discussed.  Electronically Signed Sanjuana Kava, MD 12/6/20239:45 AM

## 2022-04-09 DIAGNOSIS — G4733 Obstructive sleep apnea (adult) (pediatric): Secondary | ICD-10-CM | POA: Diagnosis not present

## 2022-04-25 DIAGNOSIS — G4733 Obstructive sleep apnea (adult) (pediatric): Secondary | ICD-10-CM | POA: Diagnosis not present

## 2022-05-10 DIAGNOSIS — G4733 Obstructive sleep apnea (adult) (pediatric): Secondary | ICD-10-CM | POA: Diagnosis not present

## 2022-05-11 DIAGNOSIS — G4733 Obstructive sleep apnea (adult) (pediatric): Secondary | ICD-10-CM | POA: Diagnosis not present

## 2022-05-13 IMAGING — CT NM PET TUM IMG INITIAL (PI) SKULL BASE T - THIGH
7 series · 25 of 25 positions shown · non-contrast
Comparison: Chest abdomen and pelvic CTs of 07/06/2021.
Abdominopelvic CT 01/14/2021.

CLINICAL DATA: Initial treatment strategy for history of renal cell
carcinoma with developing pulmonary nodules.

EXAM:
NUCLEAR MEDICINE PET SKULL BASE TO THIGH
TECHNIQUE: 9.8 mCi F-18 FDG was injected intravenously. Full-ring PET imaging
was performed from the skull base to thigh after the radiotracer. CT
data was obtained and used for attenuation correction and anatomic
localization.
Fasting blood glucose: 113 mg/dl

[Series 3: pet sk_thigh ac · axial · 5.0mm · 4.07mm/px · z∈[-886,+50]mm · 6 of 235 slices shown]
[im 1/235]
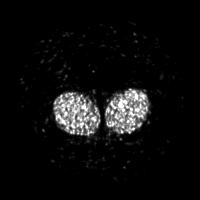
[im 47/235]
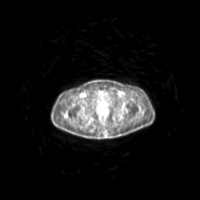
[im 94/235]
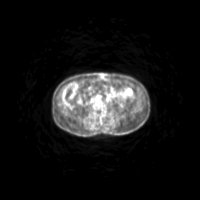
[im 141/235]
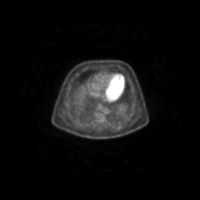
[im 188/235]
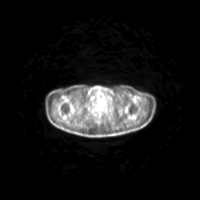
[im 235/235]
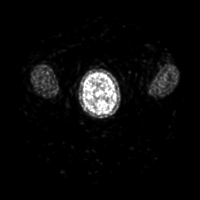

[Series 4: ct sk_thigh 5.0 br38 · axial · 5.0mm · 0.98mm/px · z∈[-886,+50]mm · 6 of 235 slices shown]
[im 1/235]
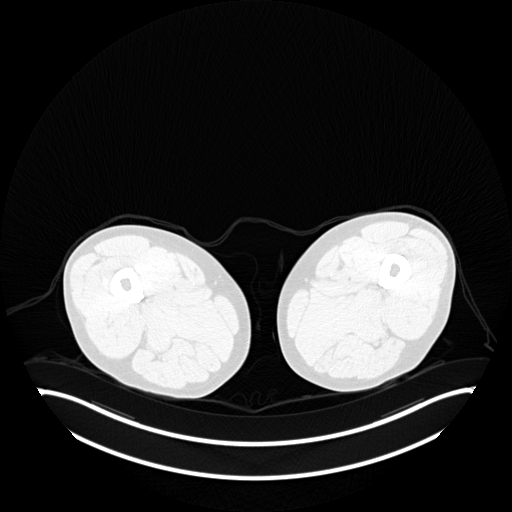
[im 47/235]
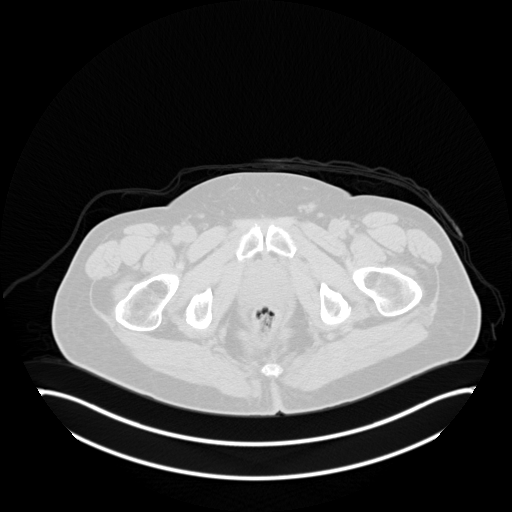
[im 94/235]
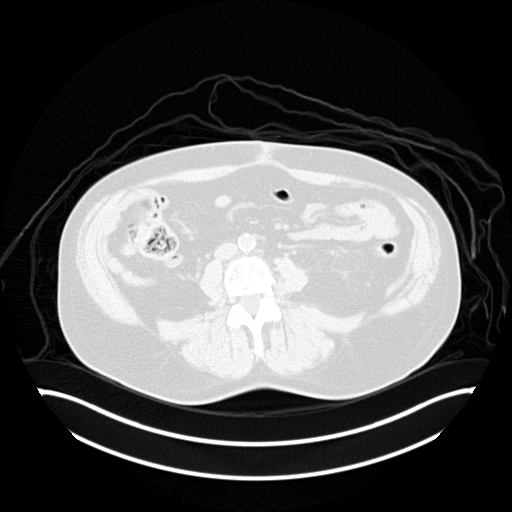
[im 141/235]
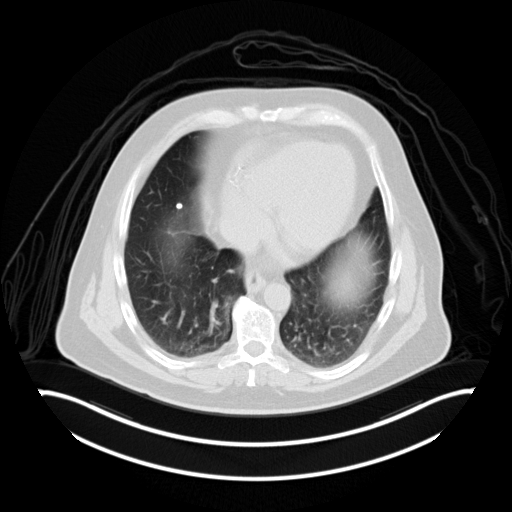
[im 188/235  brain]
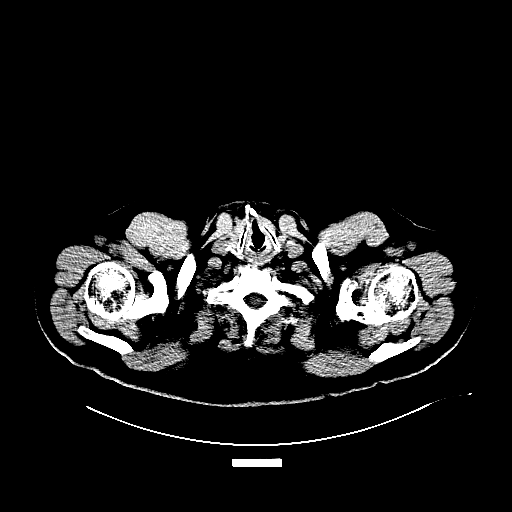
[im 235/235  brain]
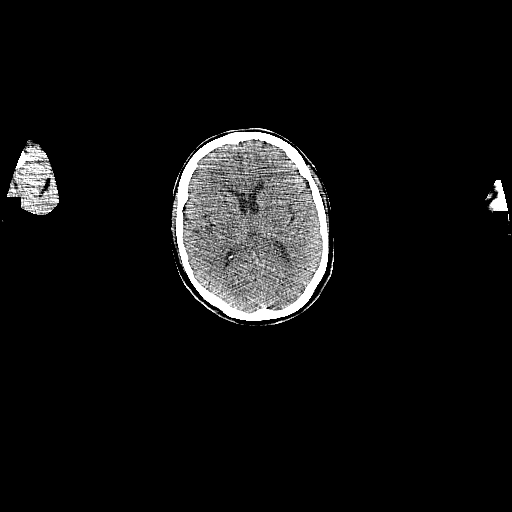

[Series 5: pet sk_thigh nac · axial · 5.0mm · 4.07mm/px · z∈[-886,+50]mm · 5 of 235 slices shown]
[im 1/235]
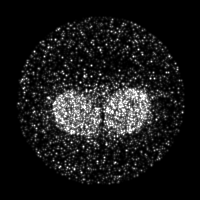
[im 59/235]
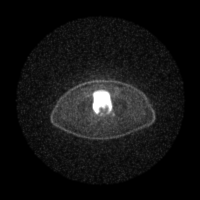
[im 118/235]
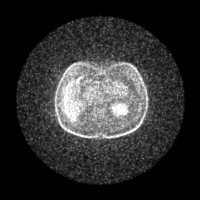
[im 176/235]
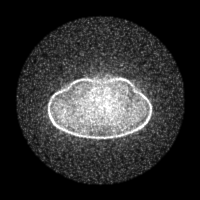
[im 235/235]
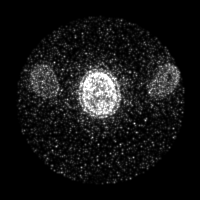

[Series 7: ct 5.0 bl57 lung_bone · axial · 5.0mm · 0.62mm/px · 1 of 62 slices shown]
[im 1/62  brain]
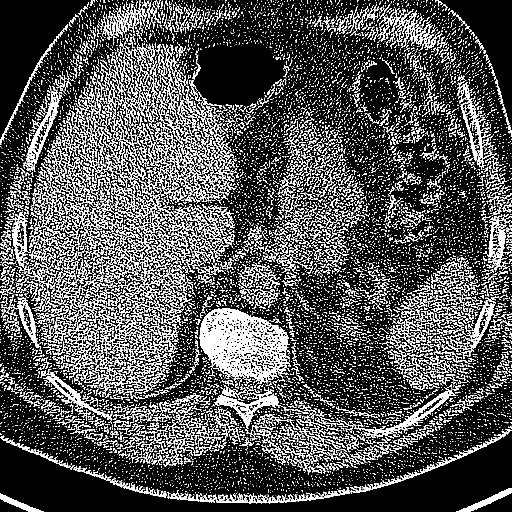

[Series 603: fused tra · 5 of 233 slices shown]
[im 1/233]
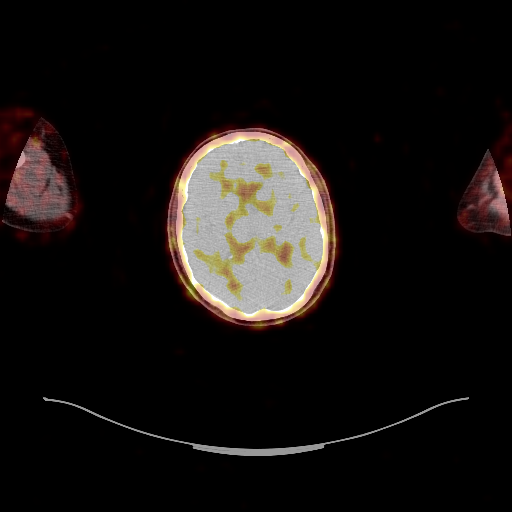
[im 59/233]
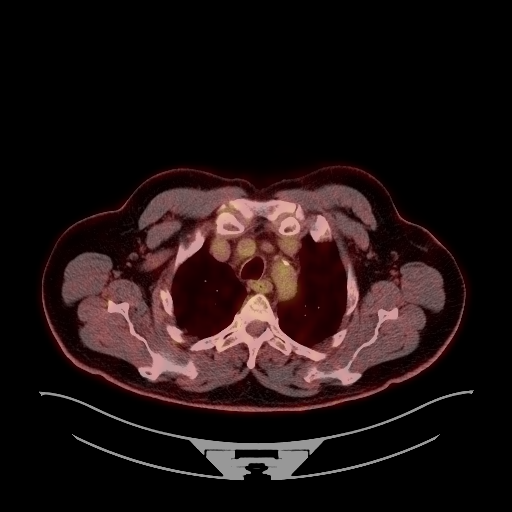
[im 117/233]
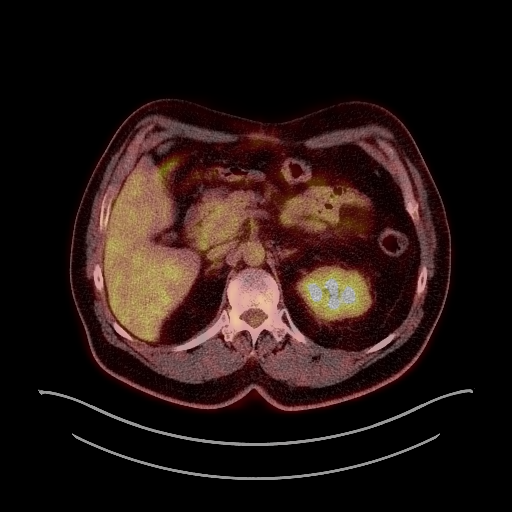
[im 175/233]
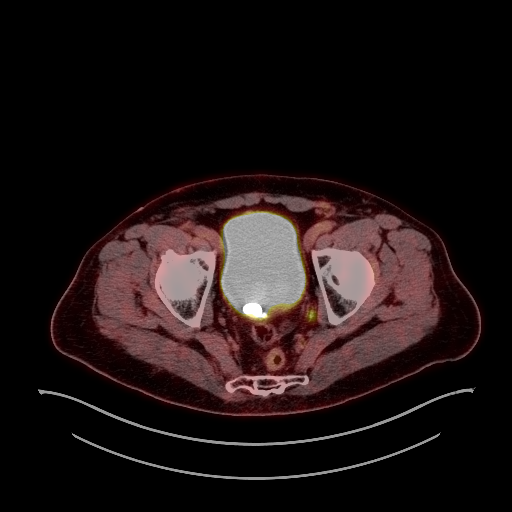
[im 233/233]
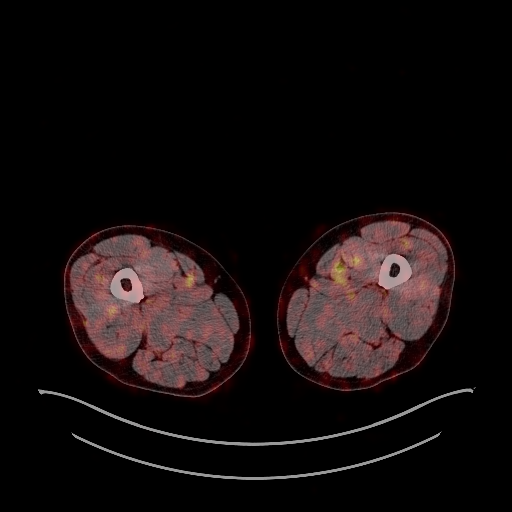

[Series 604: fused cor · 1 of 41 slices shown]
[im 1/41]
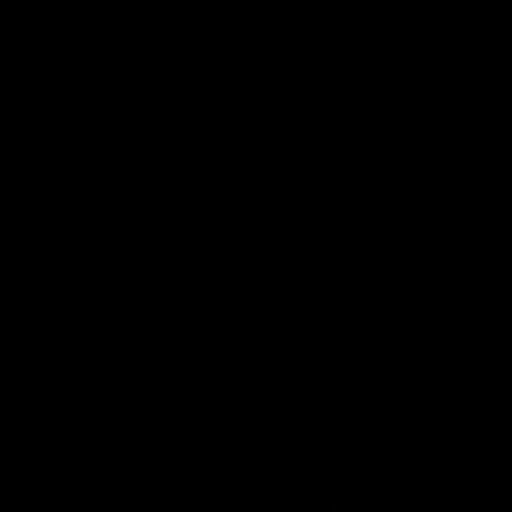

[Series 605: mip pet · coronal · 1.94mm/px · 1 of 32 slices shown]
[im 1/32]
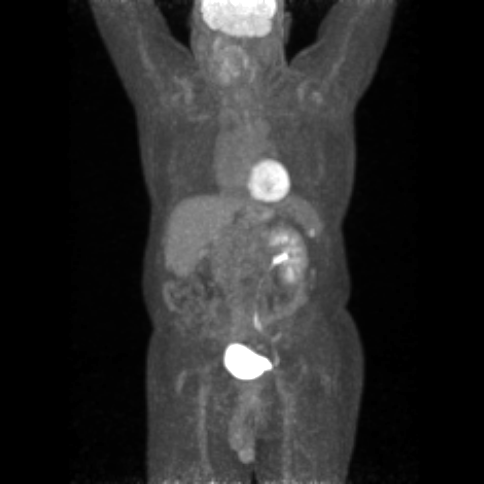

[25 of 25 positions shown; findings below may reference images not displayed]

FINDINGS: Mediastinal blood pool activity: SUV max

Liver activity: SUV max NA

NECK: No areas of abnormal hypermetabolism.

Incidental CT findings: No cervical adenopathy.

CHEST: No thoracic nodal hypermetabolism. No pulmonary parenchymal
hypermetabolism. The posterior right upper lobe mixed attenuation
nodule on the prior diagnostic chest CT is not well visualized. The
adjacent nodule or area of thickening along the right major fissure
is slightly less distinct today, given differences in slice
selection. 1.3 cm and not hypermetabolic on 81/4. 1.9 cm on the
prior exam.

The right lower lobe subpleural solid nodule is no longer
identified. Minimal subpleural interstitial thickening in this area
including on 88/4 remains.

Incidental CT findings: Aortic and coronary artery calcification.

ABDOMEN/PELVIS: No abdominopelvic adenopathy.

Incidental CT findings: Deferred to recent diagnostic CT. Normal
adrenal glands. Right nephrectomy. Normal noncontrast appearance of
the left kidney, liver, pancreas, and bowel loops. Tiny hiatal
hernia. Prostatomegaly with bladder stones.

SKELETON: Eighth anterolateral rib hypermetabolism corresponds to an
interval nonacute fracture on 113/4.

Incidental CT findings: none
IMPRESSION: 1. No hypermetabolism to correspond to the pulmonary nodules on
prior chest CT. Given thicker slice collimation and nondedicated
technique, these nodules are felt to be improved and resolved,
consistent with an infectious/inflammatory etiology.
2. No evidence of hypermetabolic metastatic disease, status post
right nephrectomy.
3. Nonacute but interval lateral left eighth rib fracture since
07/06/2021.
[DATE]. Incidental findings, including: Tiny hiatal hernia. Coronary
artery atherosclerosis. Aortic Atherosclerosis ([id number]).
Prostatomegaly with bladder outlet obstruction and bladder stones.

## 2022-05-29 DIAGNOSIS — I10 Essential (primary) hypertension: Secondary | ICD-10-CM | POA: Diagnosis not present

## 2022-05-29 DIAGNOSIS — Z85528 Personal history of other malignant neoplasm of kidney: Secondary | ICD-10-CM | POA: Diagnosis not present

## 2022-05-29 DIAGNOSIS — E782 Mixed hyperlipidemia: Secondary | ICD-10-CM | POA: Diagnosis not present

## 2022-05-29 DIAGNOSIS — Z8679 Personal history of other diseases of the circulatory system: Secondary | ICD-10-CM | POA: Diagnosis not present

## 2022-05-29 DIAGNOSIS — E559 Vitamin D deficiency, unspecified: Secondary | ICD-10-CM | POA: Diagnosis not present

## 2022-05-29 DIAGNOSIS — M810 Age-related osteoporosis without current pathological fracture: Secondary | ICD-10-CM | POA: Diagnosis not present

## 2022-05-29 DIAGNOSIS — D649 Anemia, unspecified: Secondary | ICD-10-CM | POA: Diagnosis not present

## 2022-05-29 DIAGNOSIS — G4700 Insomnia, unspecified: Secondary | ICD-10-CM | POA: Diagnosis not present

## 2022-05-29 DIAGNOSIS — I700 Atherosclerosis of aorta: Secondary | ICD-10-CM | POA: Diagnosis not present

## 2022-05-29 DIAGNOSIS — E119 Type 2 diabetes mellitus without complications: Secondary | ICD-10-CM | POA: Diagnosis not present

## 2022-05-29 DIAGNOSIS — E1169 Type 2 diabetes mellitus with other specified complication: Secondary | ICD-10-CM | POA: Diagnosis not present

## 2022-06-10 DIAGNOSIS — G4733 Obstructive sleep apnea (adult) (pediatric): Secondary | ICD-10-CM | POA: Diagnosis not present

## 2022-06-22 ENCOUNTER — Encounter: Payer: Self-pay | Admitting: Radiology

## 2022-07-07 DIAGNOSIS — D49511 Neoplasm of unspecified behavior of right kidney: Secondary | ICD-10-CM | POA: Diagnosis not present

## 2022-07-09 DIAGNOSIS — G4733 Obstructive sleep apnea (adult) (pediatric): Secondary | ICD-10-CM | POA: Diagnosis not present

## 2022-07-12 DIAGNOSIS — K8020 Calculus of gallbladder without cholecystitis without obstruction: Secondary | ICD-10-CM | POA: Diagnosis not present

## 2022-07-12 DIAGNOSIS — R918 Other nonspecific abnormal finding of lung field: Secondary | ICD-10-CM | POA: Diagnosis not present

## 2022-08-09 DIAGNOSIS — G4733 Obstructive sleep apnea (adult) (pediatric): Secondary | ICD-10-CM | POA: Diagnosis not present

## 2022-09-08 DIAGNOSIS — G4733 Obstructive sleep apnea (adult) (pediatric): Secondary | ICD-10-CM | POA: Diagnosis not present

## 2022-10-02 DIAGNOSIS — G4733 Obstructive sleep apnea (adult) (pediatric): Secondary | ICD-10-CM | POA: Diagnosis not present

## 2022-10-09 DIAGNOSIS — G4733 Obstructive sleep apnea (adult) (pediatric): Secondary | ICD-10-CM | POA: Diagnosis not present

## 2022-11-08 DIAGNOSIS — G4733 Obstructive sleep apnea (adult) (pediatric): Secondary | ICD-10-CM | POA: Diagnosis not present

## 2023-01-12 DIAGNOSIS — R911 Solitary pulmonary nodule: Secondary | ICD-10-CM | POA: Diagnosis not present

## 2023-01-12 DIAGNOSIS — K8020 Calculus of gallbladder without cholecystitis without obstruction: Secondary | ICD-10-CM | POA: Diagnosis not present

## 2023-01-12 DIAGNOSIS — Z905 Acquired absence of kidney: Secondary | ICD-10-CM | POA: Diagnosis not present

## 2023-02-26 DIAGNOSIS — Z8679 Personal history of other diseases of the circulatory system: Secondary | ICD-10-CM | POA: Diagnosis not present

## 2023-02-26 DIAGNOSIS — Z0000 Encounter for general adult medical examination without abnormal findings: Secondary | ICD-10-CM | POA: Diagnosis not present

## 2023-02-26 DIAGNOSIS — E1169 Type 2 diabetes mellitus with other specified complication: Secondary | ICD-10-CM | POA: Diagnosis not present

## 2023-02-26 DIAGNOSIS — I10 Essential (primary) hypertension: Secondary | ICD-10-CM | POA: Diagnosis not present

## 2023-02-26 DIAGNOSIS — I700 Atherosclerosis of aorta: Secondary | ICD-10-CM | POA: Diagnosis not present

## 2023-02-26 DIAGNOSIS — E782 Mixed hyperlipidemia: Secondary | ICD-10-CM | POA: Diagnosis not present

## 2023-02-26 DIAGNOSIS — Z23 Encounter for immunization: Secondary | ICD-10-CM | POA: Diagnosis not present

## 2023-02-26 DIAGNOSIS — E119 Type 2 diabetes mellitus without complications: Secondary | ICD-10-CM | POA: Diagnosis not present

## 2023-02-26 DIAGNOSIS — Z85528 Personal history of other malignant neoplasm of kidney: Secondary | ICD-10-CM | POA: Diagnosis not present

## 2023-02-26 DIAGNOSIS — E559 Vitamin D deficiency, unspecified: Secondary | ICD-10-CM | POA: Diagnosis not present

## 2023-03-27 DIAGNOSIS — D123 Benign neoplasm of transverse colon: Secondary | ICD-10-CM | POA: Diagnosis not present

## 2023-03-27 DIAGNOSIS — D175 Benign lipomatous neoplasm of intra-abdominal organs: Secondary | ICD-10-CM | POA: Diagnosis not present

## 2023-03-27 DIAGNOSIS — D127 Benign neoplasm of rectosigmoid junction: Secondary | ICD-10-CM | POA: Diagnosis not present

## 2023-03-27 DIAGNOSIS — Z1211 Encounter for screening for malignant neoplasm of colon: Secondary | ICD-10-CM | POA: Diagnosis not present

## 2023-03-27 DIAGNOSIS — K640 First degree hemorrhoids: Secondary | ICD-10-CM | POA: Diagnosis not present

## 2023-03-29 DIAGNOSIS — D127 Benign neoplasm of rectosigmoid junction: Secondary | ICD-10-CM | POA: Diagnosis not present

## 2023-03-29 DIAGNOSIS — D123 Benign neoplasm of transverse colon: Secondary | ICD-10-CM | POA: Diagnosis not present

## 2023-05-01 DIAGNOSIS — G4733 Obstructive sleep apnea (adult) (pediatric): Secondary | ICD-10-CM | POA: Diagnosis not present

## 2023-08-06 DIAGNOSIS — K8020 Calculus of gallbladder without cholecystitis without obstruction: Secondary | ICD-10-CM | POA: Diagnosis not present

## 2023-08-06 DIAGNOSIS — Z905 Acquired absence of kidney: Secondary | ICD-10-CM | POA: Diagnosis not present

## 2023-08-28 DIAGNOSIS — I10 Essential (primary) hypertension: Secondary | ICD-10-CM | POA: Diagnosis not present

## 2023-08-28 DIAGNOSIS — E559 Vitamin D deficiency, unspecified: Secondary | ICD-10-CM | POA: Diagnosis not present

## 2023-08-28 DIAGNOSIS — E1169 Type 2 diabetes mellitus with other specified complication: Secondary | ICD-10-CM | POA: Diagnosis not present

## 2023-08-28 DIAGNOSIS — E782 Mixed hyperlipidemia: Secondary | ICD-10-CM | POA: Diagnosis not present

## 2023-08-28 DIAGNOSIS — M810 Age-related osteoporosis without current pathological fracture: Secondary | ICD-10-CM | POA: Diagnosis not present

## 2023-08-28 DIAGNOSIS — D649 Anemia, unspecified: Secondary | ICD-10-CM | POA: Diagnosis not present

## 2023-12-14 ENCOUNTER — Encounter: Payer: Self-pay | Admitting: Radiology

## 2024-01-18 DIAGNOSIS — D49511 Neoplasm of unspecified behavior of right kidney: Secondary | ICD-10-CM | POA: Diagnosis not present

## 2024-02-25 ENCOUNTER — Encounter: Payer: Self-pay | Admitting: Radiology

## 2024-03-10 NOTE — Progress Notes (Signed)
 Edwin Levine                                          MRN: [id number]   03/10/2024   The [facility] Quality Team Specialist reviewed this patient medical record for the purposes of chart review for care gap closure. The following were reviewed: chart review for care gap closure-kidney health evaluation for diabetes:eGFR  and uACR.    [facility] Quality Team
